# Patient Record
Sex: Male | Born: 1965 | Race: White | Hispanic: No | Marital: Married | Smoking: Never smoker
Health system: Southern US, Community
[De-identification: ages and names within clinical notes are randomized; demographics above are authoritative.]

## PROBLEM LIST (undated history)

## (undated) DIAGNOSIS — T7840XA Allergy, unspecified, initial encounter: Secondary | ICD-10-CM

## (undated) DIAGNOSIS — I10 Essential (primary) hypertension: Secondary | ICD-10-CM

## (undated) HISTORY — DX: Allergy, unspecified, initial encounter: T78.40XA

## (undated) HISTORY — DX: Essential (primary) hypertension: I10

---

## 2005-03-24 ENCOUNTER — Ambulatory Visit: Payer: Self-pay | Admitting: Internal Medicine

## 2012-08-02 ENCOUNTER — Ambulatory Visit: Payer: Self-pay | Admitting: Gastroenterology

## 2012-08-29 ENCOUNTER — Ambulatory Visit: Payer: Self-pay | Admitting: Urology

## 2014-10-16 ENCOUNTER — Ambulatory Visit: Payer: Self-pay

## 2015-03-26 ENCOUNTER — Emergency Department
Admit: 2015-03-26 | Disposition: A | Discharge status: Home / Self Care | Payer: Self-pay | Admitting: Emergency Medicine

## 2015-06-26 ENCOUNTER — Other Ambulatory Visit: Payer: Self-pay | Admitting: Family Medicine

## 2016-01-29 ENCOUNTER — Other Ambulatory Visit: Payer: Self-pay | Admitting: Family Medicine

## 2016-01-31 NOTE — Telephone Encounter (Signed)
apt 

## 2016-02-21 ENCOUNTER — Encounter: Payer: Self-pay | Admitting: Family Medicine

## 2016-02-21 NOTE — Telephone Encounter (Signed)
Letter sent.

## 2016-03-08 DIAGNOSIS — I10 Essential (primary) hypertension: Secondary | ICD-10-CM | POA: Insufficient documentation

## 2016-03-14 ENCOUNTER — Encounter: Payer: Self-pay | Admitting: Family Medicine

## 2016-03-14 ENCOUNTER — Ambulatory Visit (INDEPENDENT_AMBULATORY_CARE_PROVIDER_SITE_OTHER): Payer: BLUE CROSS/BLUE SHIELD | Admitting: Family Medicine

## 2016-03-14 VITALS — BP 149/87 | HR 69 | Temp 97.8°F | Ht 67.1 in | Wt 188.0 lb

## 2016-03-14 DIAGNOSIS — I10 Essential (primary) hypertension: Secondary | ICD-10-CM

## 2016-03-14 MED ORDER — AMLODIPINE BESYLATE 5 MG PO TABS: 5.0000 mg | ORAL_TABLET | Freq: Every day | ORAL | Status: DC

## 2016-03-14 NOTE — Assessment & Plan Note (Signed)
Discuss hypertension poor control need for better control with risk-benefit of therapy will start amlodipine 5 mg

## 2016-03-14 NOTE — Progress Notes (Signed)
   BP 149/87 mmHg  Pulse 69  Temp(Src) 97.8 F (36.6 C)  Ht 5' 7.1" (1.704 m)  Wt 188 lb (85.276 kg)  BMI 29.37 kg/m2  SpO2 99%   Subjective:    Patient ID: Shane Schmidt, male    DOB: [birth year], 50 y.o.   MRN: [id number]  HPI: Shane Schmidt is a 50 y.o. male  Chief Complaint  Patient presents with  . Hypertension   Patient with a lot of stress at work and pressures been elevated taking Benzapril faithfully without problems or side effects. Patient's with normal sleep normal energy no other problems or concerns Relevant past medical, surgical, family and social history reviewed and updated as indicated. Interim medical history since our last visit reviewed. Allergies and medications reviewed and updated.  Review of Systems  Constitutional: Negative.   Respiratory: Negative.   Cardiovascular: Negative.     Per HPI unless specifically indicated above     Objective:    BP 149/87 mmHg  Pulse 69  Temp(Src) 97.8 F (36.6 C)  Ht 5' 7.1" (1.704 m)  Wt 188 lb (85.276 kg)  BMI 29.37 kg/m2  SpO2 99%  Wt Readings from Last 3 Encounters:  03/14/16 188 lb (85.276 kg)  06/08/14 189 lb (85.73 kg)    Physical Exam  Constitutional: He is oriented to person, place, and time. He appears well-developed and well-nourished. No distress.  HENT:  Head: Normocephalic and atraumatic.  Right Ear: Hearing normal.  Left Ear: Hearing normal.  Nose: Nose normal.  Eyes: Conjunctivae and lids are normal. Right eye exhibits no discharge. Left eye exhibits no discharge. No scleral icterus.  Cardiovascular: Normal rate, regular rhythm and normal heart sounds.   Pulmonary/Chest: Effort normal and breath sounds normal. No respiratory distress.  Musculoskeletal: Normal range of motion.  Neurological: He is alert and oriented to person, place, and time.  Skin: Skin is intact. No rash noted.  Psychiatric: He has a normal mood and affect. His speech is normal and  behavior is normal. Judgment and thought content normal. Cognition and memory are normal.    No results found for this or any previous visit.    Assessment & Plan:   Problem List Items Addressed This Visit      Cardiovascular and Mediastinum   Hypertension - Primary    Discuss hypertension poor control need for better control with risk-benefit of therapy will start amlodipine 5 mg      Relevant Medications   amLODipine (NORVASC) 5 MG tablet       Follow up plan: Return in about 4 weeks (around 04/11/2016) for BMP recheck blood pressure.

## 2016-03-26 ENCOUNTER — Other Ambulatory Visit: Payer: Self-pay | Admitting: Family Medicine

## 2016-04-11 ENCOUNTER — Encounter: Payer: Self-pay | Admitting: Family Medicine

## 2016-04-11 ENCOUNTER — Ambulatory Visit (INDEPENDENT_AMBULATORY_CARE_PROVIDER_SITE_OTHER): Payer: BLUE CROSS/BLUE SHIELD | Admitting: Family Medicine

## 2016-04-11 VITALS — BP 130/82 | HR 71 | Temp 97.8°F | Ht 67.1 in | Wt 191.0 lb

## 2016-04-11 DIAGNOSIS — I10 Essential (primary) hypertension: Secondary | ICD-10-CM

## 2016-04-11 NOTE — Progress Notes (Signed)
   BP 130/82 mmHg  Pulse 71  Temp(Src) 97.8 F (36.6 C)  Ht 5' 7.1" (1.704 m)  Wt 191 lb (86.637 kg)  BMI 29.84 kg/m2  SpO2 99%   Subjective:    Patient ID: Shane Schmidt, male    DOB: 31-Dec-1965, 50 y.o.   MRN: [id number]  HPI: Shane Elchanan Dalesandro is a 50 y.o. male  Chief Complaint  Patient presents with  . Hypertension   Patient has been doing better with lifestyle eating better and exercising decrease salt Has not been taking medications Has home blood pressure monitoring which is showing good blood pressures all except for one elevated blood pressure reading Relevant past medical, surgical, family and social history reviewed and updated as indicated. Interim medical history since our last visit reviewed. Allergies and medications reviewed and updated.  Review of Systems  Per HPI unless specifically indicated above     Objective:    BP 130/82 mmHg  Pulse 71  Temp(Src) 97.8 F (36.6 C)  Ht 5' 7.1" (1.704 m)  Wt 191 lb (86.637 kg)  BMI 29.84 kg/m2  SpO2 99%  Wt Readings from Last 3 Encounters:  04/11/16 191 lb (86.637 kg)  03/14/16 188 lb (85.276 kg)  06/08/14 189 lb (85.73 kg)    Physical Exam  No results found for this or any previous visit.    Assessment & Plan:   Problem List Items Addressed This Visit      Cardiovascular and Mediastinum   Hypertension - Primary    Lifestyle controlled not on any medicines will continue blood pressure monitoring and good lifestyle      Relevant Orders   Basic metabolic panel       Follow up plan: Return for Physical Exam.

## 2016-04-11 NOTE — Assessment & Plan Note (Signed)
Lifestyle controlled not on any medicines will continue blood pressure monitoring and good lifestyle

## 2016-04-12 ENCOUNTER — Encounter: Payer: Self-pay | Admitting: Family Medicine

## 2016-04-12 LAB — BASIC METABOLIC PANEL
BUN/Creatinine Ratio: 22 — ABNORMAL HIGH (ref 9–20)
BUN: 21 mg/dL (ref 6–24)
CALCIUM: 9.4 mg/dL (ref 8.7–10.2)
CO2: 26 mmol/L (ref 18–29)
Chloride: 101 mmol/L (ref 96–106)
Creatinine, Ser: 0.94 mg/dL (ref 0.76–1.27)
GFR calc Af Amer: 110 mL/min/{1.73_m2} (ref >59)
GFR calc non Af Amer: 95 mL/min/{1.73_m2} (ref >59)
Glucose: 102 mg/dL — ABNORMAL HIGH (ref 65–99)
Potassium: 4.3 mmol/L (ref 3.5–5.2)
SODIUM: 144 mmol/L (ref 134–144)

## 2016-05-28 ENCOUNTER — Other Ambulatory Visit: Payer: Self-pay | Admitting: Family Medicine

## 2016-06-26 ENCOUNTER — Other Ambulatory Visit: Payer: Self-pay | Admitting: Family Medicine

## 2016-06-26 NOTE — Telephone Encounter (Signed)
Your patient.  Thanks 

## 2016-09-20 IMAGING — CT CT HEAD WITHOUT CONTRAST
3 of 6 series · 14 of 47 positions shown, 16 images · non-contrast
Comparison: None.

CLINICAL DATA: Patient struck in head by E[employer] loaded object.
Head pain. Neck pain.

EXAM:
CT HEAD WITHOUT CONTRAST
CT CERVICAL SPINE WITHOUT CONTRAST
TECHNIQUE: Multidetector CT imaging of the head and cervical spine was
performed following the standard protocol without intravenous
contrast. Multiplanar CT image reconstructions of the cervical spine
were also generated.

[Series 6: sag bone · sagittal · 0.27mm/px · 3 of 76 slices shown]
[im 26/76  brain]
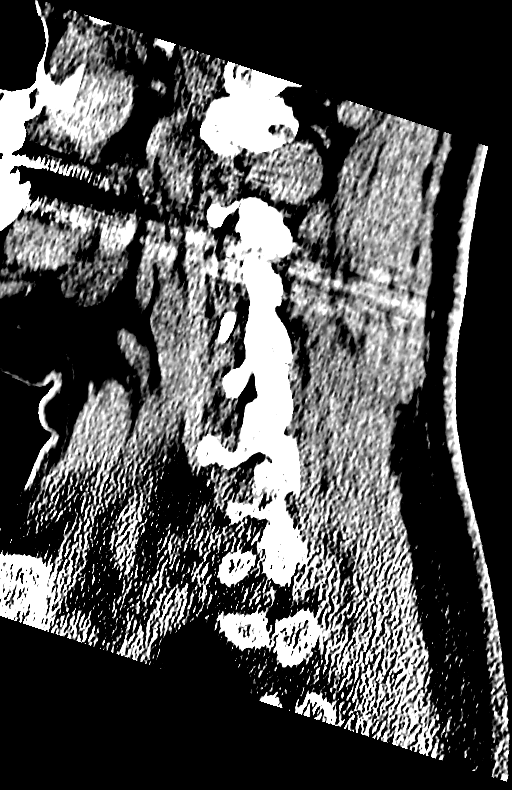
[im 38/76  brain]
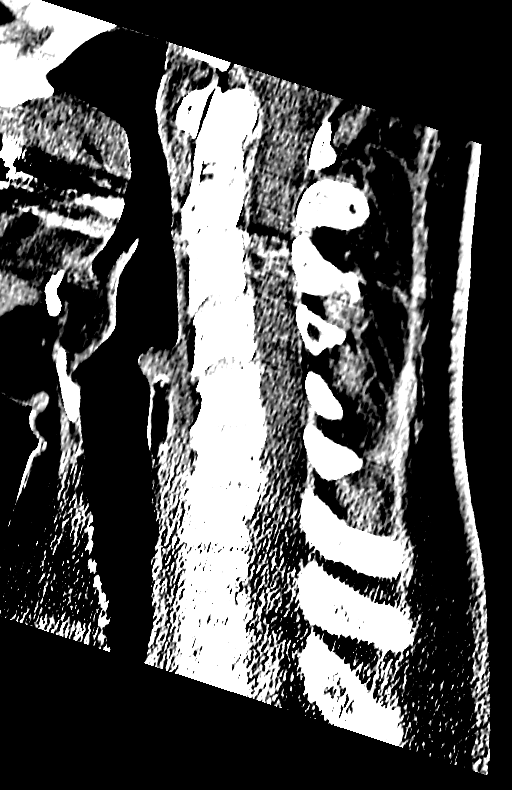
[im 51/76  brain]
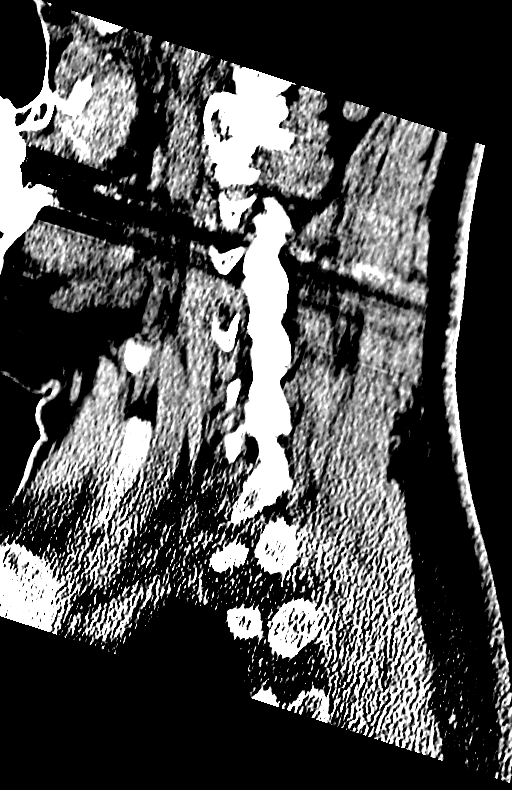

[Series 7: cor bone · coronal · 0.34mm/px · 3 of 74 slices shown]
[im 25/74  brain]
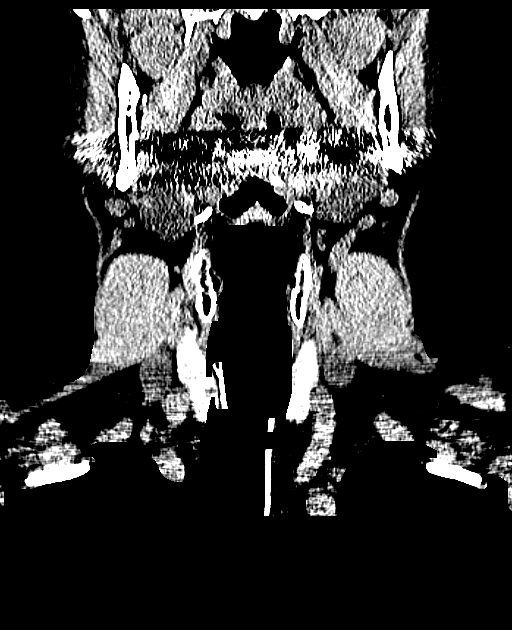
[im 33/74  brain]
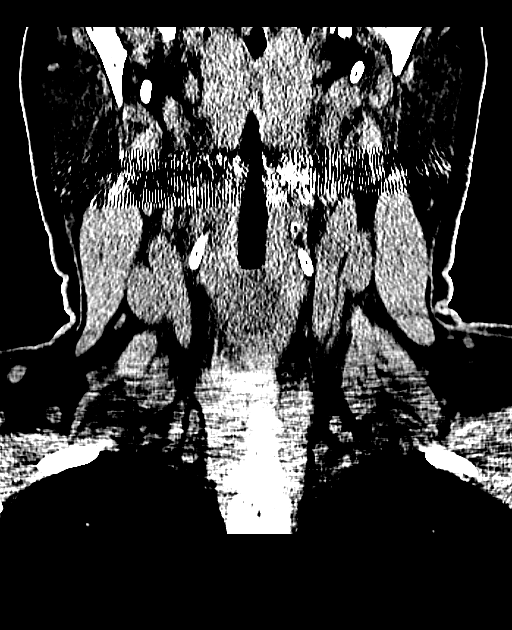
[im 41/74  brain]
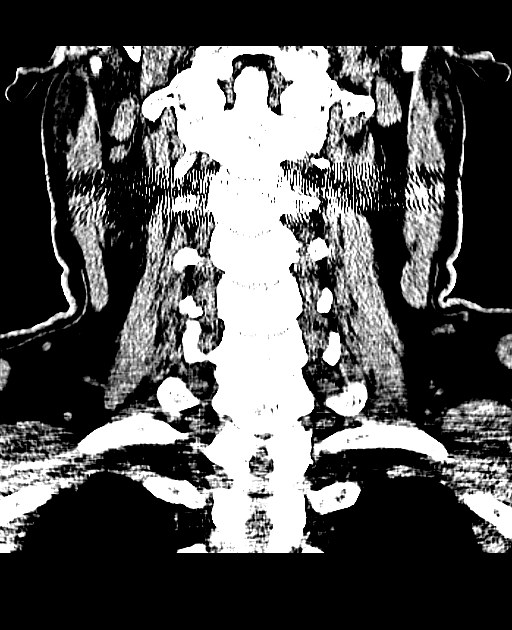

[Series 8: orthogonal axials · axial · 0.29mm/px · z∈[-400,-259]mm · 8 of 98 slices shown, 10 images]
[im 11/98  brain]
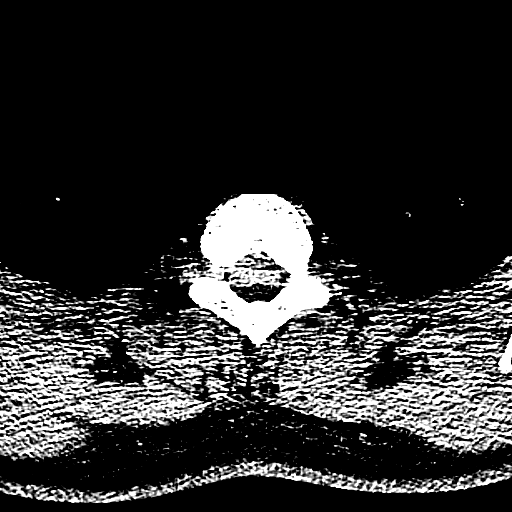
[im 11/98  bone]
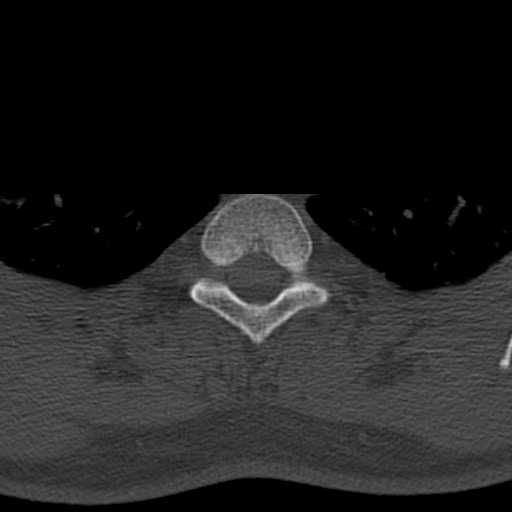
[im 22/98  brain]
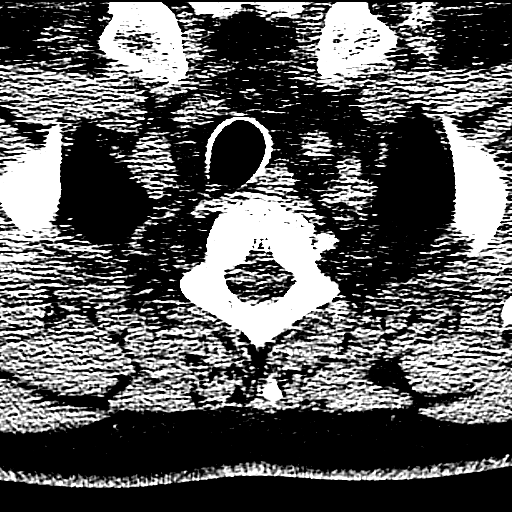
[im 33/98  brain]
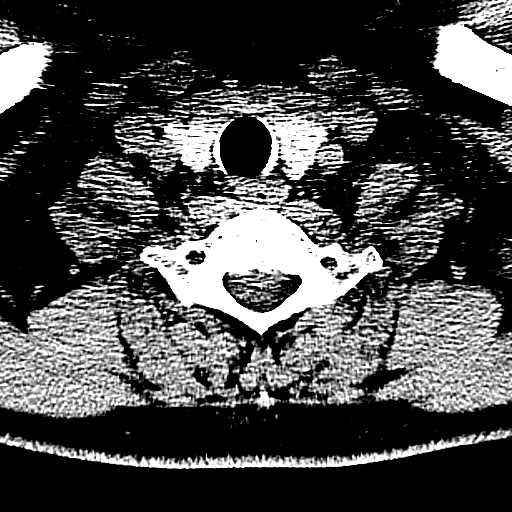
[im 44/98  brain]
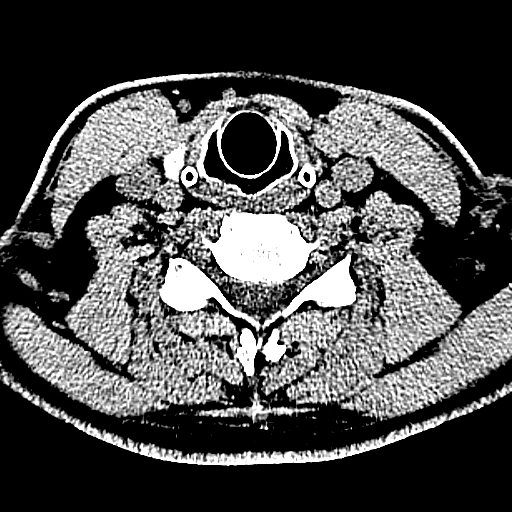
[im 54/98  brain]
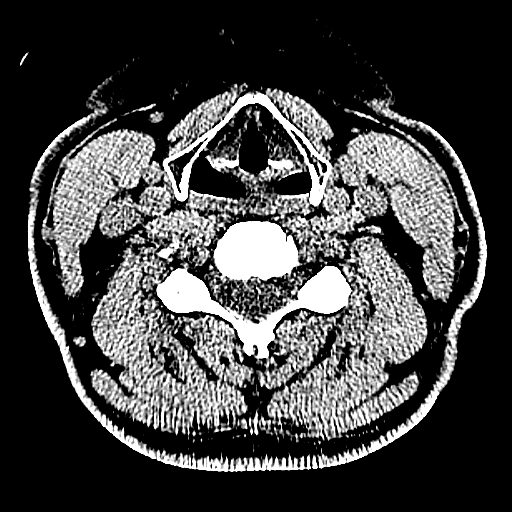
[im 54/98  bone]
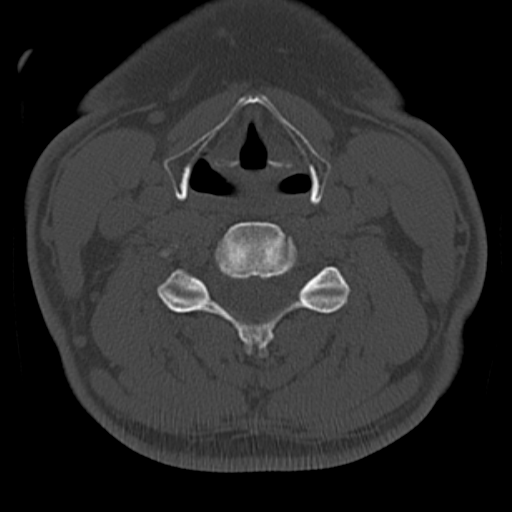
[im 65/98  brain]
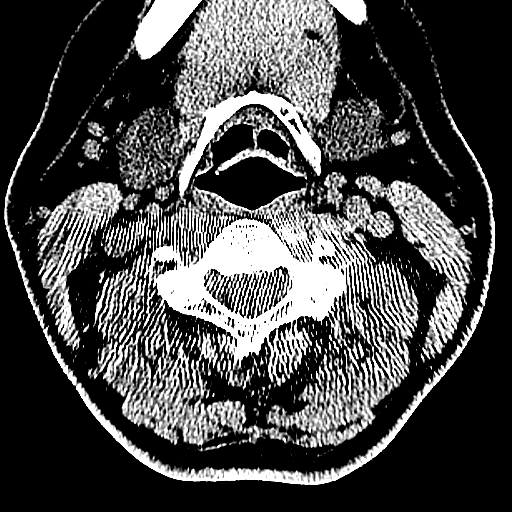
[im 76/98  brain]
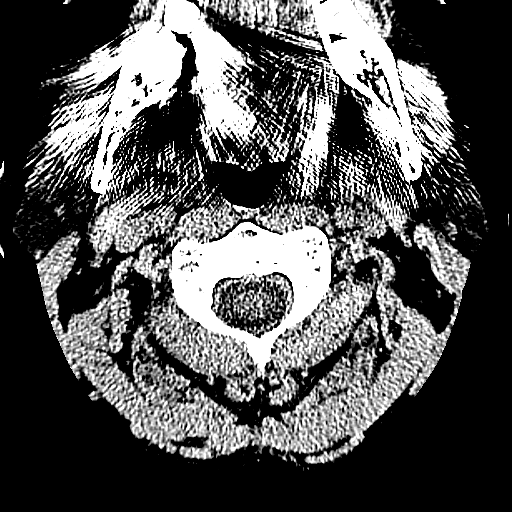
[im 87/98  brain]
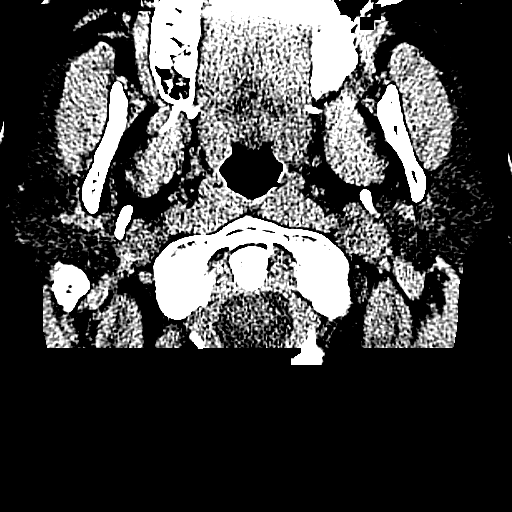

[14 of 47 positions shown; findings below may reference images not displayed]

FINDINGS: CT HEAD FINDINGS

No evidence for acute infarction, hemorrhage, mass lesion,
hydrocephalus, or extra-axial fluid. Normal cerebral volume. No
white matter disease. Calvarium intact. No sinus or mastoid disease.
There is mild LEFT frontal scalp soft tissue swelling.

CT CERVICAL SPINE FINDINGS

There is no visible cervical spine fracture, traumatic subluxation,
prevertebral soft tissue swelling, or intraspinal hematoma. Mild
reversal of the normal cervical lordotic curve likely positional but
could be due to spasm. There is moderate disc space narrowing at
C5-6. Multilevel facet arthropathy.
IMPRESSION: No skull fracture or intracranial hemorrhage. LEFT frontal scalp
soft tissue swelling.

No acute cervical spine findings. Mild reversal of the normal
cervical lordotic curve. Multilevel spondylosis.

## 2016-10-19 ENCOUNTER — Telehealth: Payer: Self-pay

## 2016-10-19 DIAGNOSIS — N200 Calculus of kidney: Secondary | ICD-10-CM

## 2016-10-19 NOTE — Telephone Encounter (Signed)
Patient called in, he would like a referral for Dr.Lipkin at [facility] for recurrent kidney stones.  Order placed

## 2016-11-16 DIAGNOSIS — N200 Calculus of kidney: Secondary | ICD-10-CM | POA: Insufficient documentation

## 2016-12-18 ENCOUNTER — Other Ambulatory Visit: Payer: Self-pay | Admitting: Family Medicine

## 2016-12-18 NOTE — Telephone Encounter (Signed)
Apt pe 

## 2016-12-18 NOTE — Telephone Encounter (Signed)
Your patient 

## 2016-12-22 ENCOUNTER — Ambulatory Visit (INDEPENDENT_AMBULATORY_CARE_PROVIDER_SITE_OTHER): Payer: 59

## 2016-12-22 DIAGNOSIS — Z23 Encounter for immunization: Secondary | ICD-10-CM | POA: Diagnosis not present

## 2016-12-27 NOTE — Telephone Encounter (Signed)
LMOM to call back

## 2017-02-08 DIAGNOSIS — H1045 Other chronic allergic conjunctivitis: Secondary | ICD-10-CM | POA: Diagnosis not present

## 2017-05-28 ENCOUNTER — Other Ambulatory Visit: Payer: Self-pay | Admitting: Family Medicine

## 2017-05-28 NOTE — Telephone Encounter (Signed)
apt 

## 2017-06-28 ENCOUNTER — Other Ambulatory Visit: Payer: Self-pay | Admitting: Family Medicine

## 2017-07-31 ENCOUNTER — Other Ambulatory Visit: Payer: Self-pay | Admitting: Family Medicine

## 2017-07-31 NOTE — Telephone Encounter (Signed)
Last OV: 04/11/16 Next OV: None on file   BMP Latest Ref Rng & Units 04/11/2016  Glucose 65 - 99 mg/dL 102(H)  BUN 6 - 24 mg/dL 21  Creatinine 0.76 - 1.27 mg/dL 0.94  BUN/Creat Ratio 9 - 20 22(H)  Sodium 134 - 144 mmol/L 144  Potassium 3.5 - 5.2 mmol/L 4.3  Chloride 96 - 106 mmol/L 101  CO2 18 - 29 mmol/L 26  Calcium 8.7 - 10.2 mg/dL 9.4

## 2017-08-02 ENCOUNTER — Encounter: Payer: Self-pay | Admitting: Family Medicine

## 2017-08-13 ENCOUNTER — Encounter: Payer: Self-pay | Admitting: Family Medicine

## 2017-08-13 ENCOUNTER — Ambulatory Visit (INDEPENDENT_AMBULATORY_CARE_PROVIDER_SITE_OTHER): Payer: 59 | Admitting: Family Medicine

## 2017-08-13 VITALS — BP 128/80 | HR 65 | Ht 68.0 in | Wt 183.0 lb

## 2017-08-13 DIAGNOSIS — Z0000 Encounter for general adult medical examination without abnormal findings: Secondary | ICD-10-CM

## 2017-08-13 DIAGNOSIS — I10 Essential (primary) hypertension: Secondary | ICD-10-CM

## 2017-08-13 DIAGNOSIS — Z1329 Encounter for screening for other suspected endocrine disorder: Secondary | ICD-10-CM

## 2017-08-13 DIAGNOSIS — Z131 Encounter for screening for diabetes mellitus: Secondary | ICD-10-CM

## 2017-08-13 DIAGNOSIS — Z125 Encounter for screening for malignant neoplasm of prostate: Secondary | ICD-10-CM | POA: Diagnosis not present

## 2017-08-13 DIAGNOSIS — Z13220 Encounter for screening for lipoid disorders: Secondary | ICD-10-CM

## 2017-08-13 DIAGNOSIS — Z23 Encounter for immunization: Secondary | ICD-10-CM | POA: Diagnosis not present

## 2017-08-13 DIAGNOSIS — Z1211 Encounter for screening for malignant neoplasm of colon: Secondary | ICD-10-CM

## 2017-08-13 LAB — URINALYSIS, ROUTINE W REFLEX MICROSCOPIC
BILIRUBIN UA: NEGATIVE
GLUCOSE, UA: NEGATIVE
Ketones, UA: NEGATIVE
Leukocytes, UA: NEGATIVE
NITRITE UA: NEGATIVE
PROTEIN UA: NEGATIVE
Specific Gravity, UA: 1.01 (ref 1.005–1.030)
UUROB: 0.2 mg/dL (ref 0.2–1.0)
pH, UA: 7 (ref 5.0–7.5)

## 2017-08-13 LAB — MICROSCOPIC EXAMINATION: WBC UA: NONE SEEN /HPF (ref 0–?)

## 2017-08-13 MED ORDER — BENAZEPRIL HCL 20 MG PO TABS: 20.0000 mg | ORAL_TABLET | Freq: Every day | ORAL | 12 refills | Status: DC

## 2017-08-13 NOTE — Progress Notes (Signed)
BP 128/80 (BP Location: Left Arm)   Pulse 65   Ht 5\' 8"  (1.727 m)   Wt 183 lb (83 kg)   SpO2 98%   BMI 27.83 kg/m    Subjective:    Patient ID: Shane Schmidt, male    DOB: [birth year], 51 y.o.   MRN: [id number]  HPI: Shane Schmidt is a 51 y.o. male presenting on 08/13/2017 for comprehensive medical examination. Current medical complaints include:none Blood pressure doing well with Benzapril blood pressure monitoring indicating good control. Takes with no side effects and takes faithfully. Taking reduced dose of 20mg . He currently lives with: Interim Problems from his last visit: no  Depression Screen done today and results listed below:  Depression screen [facility] 2/9 08/13/2017  Decreased Interest 0  Down, Depressed, Hopeless 0  PHQ - 2 Score 0       Past Medical History:  Past Medical History:  Diagnosis Date  . Allergy   . Hypertension     Surgical History:  No past surgical history on file.  Medications:  No current outpatient prescriptions on file prior to visit.   No current facility-administered medications on file prior to visit.     Allergies:  Allergies  Allergen Reactions  . Penicillins Rash    Social History:  Social History   Social History  . Marital status: Married    Spouse name: N/A  . Number of children: N/A  . Years of education: N/A   Occupational History  . Not on file.   Social History Main Topics  . Smoking status: Never Smoker  . Smokeless tobacco: Never Used  . Alcohol use No  . Drug use: No  . Sexual activity: Not on file   Other Topics Concern  . Not on file   Social History Narrative  . No narrative on file   History  Smoking Status  . Never Smoker  Smokeless Tobacco  . Never Used   History  Alcohol Use No    Family History:  Family History  Problem Relation Age of Onset  . Hypertension Brother     Past medical history, surgical history, medications, allergies, family history  and social history reviewed with patient today and changes made to appropriate areas of the chart.   Review of Systems - All other ROS negative except what is listed above and in the HPI.      Objective:    BP 128/80 (BP Location: Left Arm)   Pulse 65   Ht 5\' 8"  (1.727 m)   Wt 183 lb (83 kg)   SpO2 98%   BMI 27.83 kg/m   Wt Readings from Last 3 Encounters:  08/13/17 183 lb (83 kg)  04/11/16 191 lb (86.6 kg)  03/14/16 188 lb (85.3 kg)    Physical Exam  Constitutional: He is oriented to person, place, and time. He appears well-developed and well-nourished.  HENT:  Head: Normocephalic and atraumatic.  Right Ear: External ear normal.  Left Ear: External ear normal.  Eyes: Pupils are equal, round, and reactive to light. Conjunctivae and EOM are normal.  Neck: Normal range of motion. Neck supple.  Cardiovascular: Normal rate, regular rhythm, normal heart sounds and intact distal pulses.   Pulmonary/Chest: Effort normal and breath sounds normal.  Abdominal: Soft. Bowel sounds are normal. There is no splenomegaly or hepatomegaly.  Genitourinary: Rectum normal, prostate normal and penis normal.  Musculoskeletal: Normal range of motion.  Neurological: He is alert and oriented to person, place, and  time. He has normal reflexes.  Skin: No rash noted. No erythema.  Psychiatric: He has a normal mood and affect. His behavior is normal. Judgment and thought content normal.    Results for orders placed or performed in visit on 63/14/97  Basic metabolic panel  Result Value Ref Range   Glucose 102 (H) 65 - 99 mg/dL   BUN 21 6 - 24 mg/dL   Creatinine, Ser 0.94 0.76 - 1.27 mg/dL   GFR calc non Af Amer 95 >59 mL/min/1.73   GFR calc Af Amer 110 >59 mL/min/1.73   BUN/Creatinine Ratio 22 (H) 9 - 20   Sodium 144 134 - 144 mmol/L   Potassium 4.3 3.5 - 5.2 mmol/L   Chloride 101 96 - 106 mmol/L   CO2 26 18 - 29 mmol/L   Calcium 9.4 8.7 - 10.2 mg/dL      Assessment & Plan:   Problem List  Items Addressed This Visit      Cardiovascular and Mediastinum   Hypertension - Primary    Continue lower dose medications.      Relevant Medications   benazepril (LOTENSIN) 20 MG tablet   Other Relevant Orders   CBC with Differential/Platelet    Other Visit Diagnoses    Annual physical exam       Colon cancer screening       Relevant Orders   Ambulatory referral to Gastroenterology   Screening for diabetes mellitus (DM)       Relevant Orders   Comprehensive metabolic panel   Urinalysis, Routine w reflex microscopic   Screening cholesterol level       Relevant Orders   Lipid panel   Prostate cancer screening       Relevant Orders   PSA   Thyroid disorder screen       Relevant Orders   TSH   Needs flu shot       Relevant Orders   Flu Vaccine QUAD 6+ mos PF IM (Fluarix Quad PF) (Completed)       Discussed aspirin prophylaxis for myocardial infarction prevention and decision was it was not indicated  LABORATORY TESTING:  Health maintenance labs ordered today as discussed above.   The natural history of prostate cancer and ongoing controversy regarding screening and potential treatment outcomes of prostate cancer has been discussed with the patient. The meaning of a false positive PSA and a false negative PSA has been discussed. He indicates understanding of the limitations of this screening test and wishes  to proceed with screening PSA testing.   IMMUNIZATIONS:   - Tdap: Tetanus vaccination status reviewed: last tetanus booster within 10 years. - Influenza: Administered today - Pneumovax: Not applicable - Prevnar: Not applicable - Zostavax vaccine: Not applicable  SCREENING: - Colonoscopy: Ordered today  Discussed with patient purpose of the colonoscopy is to detect colon cancer at curable precancerous or early stages   - AAA Screening: Not applicable  -Hearing Test: Not applicable  -Spirometry: Not applicable   PATIENT COUNSELING:    Sexuality: Discussed  sexually transmitted diseases, partner selection, use of condoms, avoidance of unintended pregnancy  and contraceptive alternatives.   Advised to avoid cigarette smoking.  I discussed with the patient that most people either abstain from alcohol or drink within safe limits (<=14/week and <=4 drinks/occasion for males, <=7/weeks and <= 3 drinks/occasion for females) and that the risk for alcohol disorders and other health effects rises proportionally with the number of drinks per week and how often a drinker  exceeds daily limits.  Discussed cessation/primary prevention of drug use and availability of treatment for abuse.   Diet: Encouraged to adjust caloric intake to maintain  or achieve ideal body weight, to reduce intake of dietary saturated fat and total fat, to limit sodium intake by avoiding high sodium foods and not adding table salt, and to maintain adequate dietary potassium and calcium preferably from fresh fruits, vegetables, and low-fat dairy products.    stressed the importance of regular exercise  Injury prevention: Discussed safety belts, safety helmets, smoke detector, smoking near bedding or upholstery.   Dental health: Discussed importance of regular tooth brushing, flossing, and dental visits.   Follow up plan: NEXT PREVENTATIVE PHYSICAL DUE IN 1 YEAR. Return in about 6 months (around 02/10/2018) for BMP.

## 2017-08-13 NOTE — Assessment & Plan Note (Signed)
Continue lower dose medications.

## 2017-08-13 NOTE — Patient Instructions (Addendum)

## 2017-08-14 ENCOUNTER — Encounter: Payer: Self-pay | Admitting: Family Medicine

## 2017-08-14 LAB — COMPREHENSIVE METABOLIC PANEL
A/G RATIO: 1.8 (ref 1.2–2.2)
ALK PHOS: 59 IU/L (ref 39–117)
ALT: 34 IU/L (ref 0–44)
AST: 30 IU/L (ref 0–40)
Albumin: 4.6 g/dL (ref 3.5–5.5)
BILIRUBIN TOTAL: 1.5 mg/dL — AB (ref 0.0–1.2)
BUN/Creatinine Ratio: 22 — ABNORMAL HIGH (ref 9–20)
BUN: 22 mg/dL (ref 6–24)
CO2: 24 mmol/L (ref 20–29)
Calcium: 9.7 mg/dL (ref 8.7–10.2)
Chloride: 99 mmol/L (ref 96–106)
Creatinine, Ser: 1 mg/dL (ref 0.76–1.27)
GFR calc Af Amer: 101 mL/min/{1.73_m2} (ref >59)
GFR calc non Af Amer: 87 mL/min/{1.73_m2} (ref >59)
Globulin, Total: 2.6 g/dL (ref 1.5–4.5)
Glucose: 79 mg/dL (ref 65–99)
POTASSIUM: 4.3 mmol/L (ref 3.5–5.2)
Sodium: 143 mmol/L (ref 134–144)
Total Protein: 7.2 g/dL (ref 6.0–8.5)

## 2017-08-14 LAB — CBC WITH DIFFERENTIAL/PLATELET
Basophils Absolute: 0.1 10*3/uL (ref 0.0–0.2)
Basos: 1 %
EOS (ABSOLUTE): 0.5 10*3/uL — AB (ref 0.0–0.4)
Eos: 7 %
Hematocrit: 47.4 % (ref 37.5–51.0)
Hemoglobin: 15.7 g/dL (ref 13.0–17.7)
Immature Grans (Abs): 0 10*3/uL (ref 0.0–0.1)
Immature Granulocytes: 0 %
Lymphocytes Absolute: 2.5 10*3/uL (ref 0.7–3.1)
Lymphs: 38 %
MCH: 28.9 pg (ref 26.6–33.0)
MCHC: 33.1 g/dL (ref 31.5–35.7)
MCV: 87 fL (ref 79–97)
Monocytes Absolute: 0.5 10*3/uL (ref 0.1–0.9)
Monocytes: 8 %
NEUTROS ABS: 3.1 10*3/uL (ref 1.4–7.0)
Neutrophils: 46 %
PLATELETS: 291 10*3/uL (ref 150–379)
RBC: 5.44 x10E6/uL (ref 4.14–5.80)
RDW: 14.3 % (ref 12.3–15.4)
WBC: 6.7 10*3/uL (ref 3.4–10.8)

## 2017-08-14 LAB — LIPID PANEL
CHOLESTEROL TOTAL: 167 mg/dL (ref 100–199)
Chol/HDL Ratio: 3.3 ratio (ref 0.0–5.0)
HDL: 50 mg/dL (ref >39)
LDL Calculated: 98 mg/dL (ref 0–99)
Triglycerides: 96 mg/dL (ref 0–149)
VLDL Cholesterol Cal: 19 mg/dL (ref 5–40)

## 2017-08-14 LAB — TSH: TSH: 2.28 u[IU]/mL (ref 0.450–4.500)

## 2017-08-14 LAB — PSA: Prostate Specific Ag, Serum: 1.2 ng/mL (ref 0.0–4.0)

## 2017-08-28 ENCOUNTER — Other Ambulatory Visit: Payer: Self-pay | Admitting: Unknown Physician Specialty

## 2017-08-28 NOTE — Telephone Encounter (Signed)
Your patient.  Thanks 

## 2017-10-16 ENCOUNTER — Telehealth: Payer: Self-pay

## 2017-10-16 DIAGNOSIS — Z1211 Encounter for screening for malignant neoplasm of colon: Secondary | ICD-10-CM

## 2017-10-16 NOTE — Telephone Encounter (Signed)
Per provider pt wishes to go to [facility] instead of [facility]. [facility] referral had already been closed, new referral generated.

## 2017-10-22 DIAGNOSIS — N200 Calculus of kidney: Secondary | ICD-10-CM | POA: Diagnosis not present

## 2017-12-11 DIAGNOSIS — N200 Calculus of kidney: Secondary | ICD-10-CM | POA: Diagnosis not present

## 2017-12-11 DIAGNOSIS — R82991 Hypocitraturia: Secondary | ICD-10-CM | POA: Diagnosis not present

## 2017-12-11 DIAGNOSIS — R82993 Hyperuricosuria: Secondary | ICD-10-CM | POA: Diagnosis not present

## 2017-12-27 DIAGNOSIS — K648 Other hemorrhoids: Secondary | ICD-10-CM | POA: Diagnosis not present

## 2017-12-27 DIAGNOSIS — Z1211 Encounter for screening for malignant neoplasm of colon: Secondary | ICD-10-CM | POA: Diagnosis not present

## 2017-12-27 DIAGNOSIS — I129 Hypertensive chronic kidney disease with stage 1 through stage 4 chronic kidney disease, or unspecified chronic kidney disease: Secondary | ICD-10-CM | POA: Diagnosis not present

## 2017-12-27 DIAGNOSIS — I10 Essential (primary) hypertension: Secondary | ICD-10-CM | POA: Diagnosis not present

## 2017-12-27 LAB — HM COLONOSCOPY

## 2018-02-19 ENCOUNTER — Encounter: Payer: Self-pay | Admitting: Family Medicine

## 2018-02-19 ENCOUNTER — Ambulatory Visit (INDEPENDENT_AMBULATORY_CARE_PROVIDER_SITE_OTHER): Payer: 59 | Admitting: Family Medicine

## 2018-02-19 VITALS — BP 132/68 | HR 82 | Ht 68.0 in | Wt 182.0 lb

## 2018-02-19 DIAGNOSIS — I10 Essential (primary) hypertension: Secondary | ICD-10-CM

## 2018-02-19 DIAGNOSIS — Z114 Encounter for screening for human immunodeficiency virus [HIV]: Secondary | ICD-10-CM | POA: Diagnosis not present

## 2018-02-19 NOTE — Progress Notes (Signed)
   BP 132/68   Pulse 82   Ht 5\' 8"  (1.727 m)   Wt 182 lb (82.6 kg)   SpO2 98%   BMI 27.67 kg/m    Subjective:    Patient ID: Shane Schmidt, male    DOB: 03-09-1966, 52 y.o.   MRN: [id number]  HPI: Shane Schmidt is a 52 y.o. male  Chief Complaint  Patient presents with  . Follow-up  . Hypertension   Patient doing well with no complaints taking Benzapril without problems or issues good control of blood pressure at home. Patient also with weight gain is been on some medications and is now starting to watch diet nutrition especially sugars and carbohydrates.  Is starting to lose some weight. Relevant past medical, surgical, family and social history reviewed and updated as indicated. Interim medical history since our last visit reviewed. Allergies and medications reviewed and updated.  Review of Systems  Constitutional: Negative.   Respiratory: Negative.   Cardiovascular: Negative.     Per HPI unless specifically indicated above     Objective:    BP 132/68   Pulse 82   Ht 5\' 8"  (1.727 m)   Wt 182 lb (82.6 kg)   SpO2 98%   BMI 27.67 kg/m   Wt Readings from Last 3 Encounters:  02/19/18 182 lb (82.6 kg)  08/13/17 183 lb (83 kg)  04/11/16 191 lb (86.6 kg)    Physical Exam  Constitutional: He is oriented to person, place, and time. He appears well-developed and well-nourished.  HENT:  Head: Normocephalic and atraumatic.  Eyes: Conjunctivae and EOM are normal.  Neck: Normal range of motion.  Cardiovascular: Normal rate, regular rhythm and normal heart sounds.  Pulmonary/Chest: Effort normal and breath sounds normal.  Musculoskeletal: Normal range of motion.  Neurological: He is alert and oriented to person, place, and time.  Skin: No erythema.  Psychiatric: He has a normal mood and affect. His behavior is normal. Judgment and thought content normal.    Results for orders placed or performed in visit on 12/31/17  HM COLONOSCOPY  Result  Value Ref Range   HM Colonoscopy See Report (in chart) See Report (in chart), Patient Reported      Assessment & Plan:   Problem List Items Addressed This Visit      Cardiovascular and Mediastinum   Hypertension - Primary    The current medical regimen is effective;  continue present plan and medications.        Other Visit Diagnoses    Encounter for screening for HIV        Discussed weight gain nutrition diet exercise  Follow up plan: Return in about 6 months (around 08/22/2018) for Physical Exam.

## 2018-02-19 NOTE — Assessment & Plan Note (Signed)
The current medical regimen is effective;  continue present plan and medications.  

## 2018-08-27 ENCOUNTER — Other Ambulatory Visit: Payer: Self-pay | Admitting: Family Medicine

## 2018-09-05 ENCOUNTER — Encounter: Payer: 59 | Admitting: Family Medicine

## 2018-09-23 ENCOUNTER — Other Ambulatory Visit: Payer: Self-pay | Admitting: Family Medicine

## 2018-09-23 NOTE — Telephone Encounter (Signed)
Patient called, left VM to return call to the office to schedule and OV in order to receive refills.

## 2018-09-23 NOTE — Telephone Encounter (Signed)
Requested medication (s) are due for refill today: Yes  Requested medication (s) are on the active medication list: Yes  Last refill:  08/19/18  Future visit scheduled: NO  Notes to clinic:  Unable to refill per protocol, overdue labs/appointment     Requested Prescriptions  Pending Prescriptions Disp Refills   benazepril (LOTENSIN) 20 MG tablet [Pharmacy Med Name: BENAZEPRIL 20MG  TABLETS] 30 tablet 0    Sig: TAKE 1 TABLET BY MOUTH DAILY     Cardiovascular:  ACE Inhibitors Failed - 09/23/2018  5:56 PM      Failed - Cr in normal range and within 180 days    Creatinine, Ser  Date Value Ref Range Status  08/13/2017 1.00 0.76 - 1.27 mg/dL Final         Failed - K in normal range and within 180 days    Potassium  Date Value Ref Range Status  08/13/2017 4.3 3.5 - 5.2 mmol/L Final         Failed - Valid encounter within last 6 months    Recent Outpatient Visits          7 months ago Essential hypertension   [place] [facility], Jeannette How, MD   1 year ago Essential hypertension   [place] [facility], Jeannette How, MD   2 years ago Essential hypertension   [place], Jeannette How, MD   2 years ago Essential hypertension   [place], Jeannette How, MD             Passed - Patient is not pregnant      Passed - Last BP in normal range    BP Readings from Last 1 Encounters:  02/19/18 132/68

## 2018-09-24 NOTE — Telephone Encounter (Signed)
LVM for pt to schedule an appt.

## 2018-09-24 NOTE — Telephone Encounter (Signed)
Needs follow up appointment.  

## 2018-09-25 ENCOUNTER — Encounter: Payer: Self-pay | Admitting: Family Medicine

## 2018-09-25 NOTE — Telephone Encounter (Signed)
Letter printed to mail. °

## 2018-10-21 DIAGNOSIS — N200 Calculus of kidney: Secondary | ICD-10-CM | POA: Diagnosis not present

## 2018-10-28 ENCOUNTER — Ambulatory Visit: Payer: 59 | Admitting: Family Medicine

## 2018-10-28 ENCOUNTER — Encounter: Payer: Self-pay | Admitting: Family Medicine

## 2018-10-28 VITALS — BP 170/100 | HR 72 | Temp 98.4°F | Wt 183.0 lb

## 2018-10-28 DIAGNOSIS — N200 Calculus of kidney: Secondary | ICD-10-CM | POA: Diagnosis not present

## 2018-10-28 DIAGNOSIS — Z0000 Encounter for general adult medical examination without abnormal findings: Secondary | ICD-10-CM | POA: Diagnosis not present

## 2018-10-28 DIAGNOSIS — I10 Essential (primary) hypertension: Secondary | ICD-10-CM

## 2018-10-28 MED ORDER — BENAZEPRIL HCL 20 MG PO TABS: 20.0000 mg | ORAL_TABLET | Freq: Every day | ORAL | 4 refills | Status: DC

## 2018-10-28 NOTE — Progress Notes (Signed)
BP (!) 170/100   Pulse 72   Temp 98.4 F (36.9 C) (Oral)   Wt 183 lb (83 kg)   SpO2 98%   BMI 27.83 kg/m    Subjective:    Patient ID: Shane Schmidt, male    DOB: 1966-09-22, 52 y.o.   MRN: [id number]  HPI: Shane Schmidt is a 52 y.o. 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 Takes Benzapril 20 mg once a day without problems has taken 40 in the past without problems. Patient very concerned about taking more blood pressure medicine. Feels with lifestyle and self-care he can do better with his blood pressure which was encouraged.  Relevant past medical, surgical, family and social history reviewed and updated as indicated. Interim medical history since our last visit reviewed. Allergies and medications reviewed and updated.  Review of Systems  Constitutional: Negative.   HENT: Negative.   Eyes: Negative.   Respiratory: Negative.   Cardiovascular: Negative.   Gastrointestinal: Negative.   Endocrine: Negative.   Genitourinary: Negative.   Musculoskeletal: Negative.   Skin: Negative.   Allergic/Immunologic: Negative.   Neurological: Negative.   Hematological: Negative.   Psychiatric/Behavioral: Negative.     Per HPI unless specifically indicated above     Objective:    BP (!) 170/100   Pulse 72   Temp 98.4 F (36.9 C) (Oral)   Wt 183 lb (83 kg)   SpO2 98%   BMI 27.83 kg/m   Wt Readings from Last 3 Encounters:  10/28/18 183 lb (83 kg)  02/19/18 182 lb (82.6 kg)  08/13/17 183 lb (83 kg)    Physical Exam  Constitutional: He is oriented to person, place, and time. He appears well-developed and well-nourished.  HENT:  Head: Normocephalic and atraumatic.  Right Ear: External ear normal.  Left Ear: External ear normal.  Eyes: Pupils are equal, round, and reactive to light. Conjunctivae and EOM are normal.  Neck: Normal range of motion. Neck  supple.  Cardiovascular: Normal rate, regular rhythm, normal heart sounds and intact distal pulses.  Pulmonary/Chest: Effort normal and breath sounds normal.  Abdominal: Soft. Bowel sounds are normal. There is no splenomegaly or hepatomegaly.  Genitourinary: Rectum normal, prostate normal and penis normal.  Musculoskeletal: Normal range of motion.  Neurological: He is alert and oriented to person, place, and time. He has normal reflexes.  Skin: No rash noted. No erythema.  Psychiatric: He has a normal mood and affect. His behavior is normal. Judgment and thought content normal.    Results for orders placed or performed in visit on 12/31/17  HM COLONOSCOPY  Result Value Ref Range   HM Colonoscopy See Report (in chart) See Report (in chart), Patient Reported      Assessment & Plan:   Problem List Items Addressed This Visit      Cardiovascular and Mediastinum   Hypertension    Poor control blood pressure will increase benazepril from 20 mg increased to 40 mg.  Recheck in 1 to 2 months.      Relevant Medications   benazepril (LOTENSIN) 20 MG tablet     Genitourinary   Recurrent nephrolithiasis    No further problems at present       Other Visit Diagnoses    PE (physical exam), annual    -  Primary   Relevant Orders   Comprehensive metabolic panel   Lipid panel  CBC with Differential/Platelet   TSH   Urinalysis, Routine w reflex microscopic   PSA       Follow up plan: Return in about 2 months (around 12/28/2018) for BP check.

## 2018-10-28 NOTE — Assessment & Plan Note (Signed)
Poor control blood pressure will increase benazepril from 20 mg increased to 40 mg.  Recheck in 1 to 2 months.

## 2018-10-28 NOTE — Assessment & Plan Note (Signed)
No further problems at present

## 2018-10-29 ENCOUNTER — Encounter: Payer: Self-pay | Admitting: Family Medicine

## 2018-10-29 LAB — URINALYSIS, ROUTINE W REFLEX MICROSCOPIC
Bilirubin, UA: NEGATIVE
GLUCOSE, UA: NEGATIVE
Ketones, UA: NEGATIVE
Leukocytes, UA: NEGATIVE
Nitrite, UA: NEGATIVE
PH UA: 6.5 (ref 5.0–7.5)
Protein, UA: NEGATIVE
Specific Gravity, UA: 1.015 (ref 1.005–1.030)
Urobilinogen, Ur: 0.2 mg/dL (ref 0.2–1.0)

## 2018-10-29 LAB — COMPREHENSIVE METABOLIC PANEL
A/G RATIO: 1.7 (ref 1.2–2.2)
ALT: 29 IU/L (ref 0–44)
AST: 22 IU/L (ref 0–40)
Albumin: 4.8 g/dL (ref 3.5–5.5)
Alkaline Phosphatase: 63 IU/L (ref 39–117)
BUN/Creatinine Ratio: 21 — ABNORMAL HIGH (ref 9–20)
BUN: 22 mg/dL (ref 6–24)
Bilirubin Total: 1 mg/dL (ref 0.0–1.2)
CALCIUM: 9.3 mg/dL (ref 8.7–10.2)
CO2: 25 mmol/L (ref 20–29)
Chloride: 100 mmol/L (ref 96–106)
Creatinine, Ser: 1.03 mg/dL (ref 0.76–1.27)
GFR calc non Af Amer: 83 mL/min/{1.73_m2} (ref >59)
GFR, EST AFRICAN AMERICAN: 96 mL/min/{1.73_m2} (ref >59)
GLUCOSE: 83 mg/dL (ref 65–99)
Globulin, Total: 2.8 g/dL (ref 1.5–4.5)
POTASSIUM: 3.7 mmol/L (ref 3.5–5.2)
Sodium: 142 mmol/L (ref 134–144)
TOTAL PROTEIN: 7.6 g/dL (ref 6.0–8.5)

## 2018-10-29 LAB — CBC WITH DIFFERENTIAL/PLATELET
BASOS: 1 %
Basophils Absolute: 0.1 10*3/uL (ref 0.0–0.2)
EOS (ABSOLUTE): 0.5 10*3/uL — AB (ref 0.0–0.4)
Eos: 7 %
Hematocrit: 45.2 % (ref 37.5–51.0)
Hemoglobin: 15 g/dL (ref 13.0–17.7)
Immature Grans (Abs): 0 10*3/uL (ref 0.0–0.1)
Immature Granulocytes: 0 %
Lymphocytes Absolute: 3.1 10*3/uL (ref 0.7–3.1)
Lymphs: 40 %
MCH: 28.5 pg (ref 26.6–33.0)
MCHC: 33.2 g/dL (ref 31.5–35.7)
MCV: 86 fL (ref 79–97)
Monocytes Absolute: 0.5 10*3/uL (ref 0.1–0.9)
Monocytes: 7 %
NEUTROS ABS: 3.5 10*3/uL (ref 1.4–7.0)
Neutrophils: 45 %
PLATELETS: 291 10*3/uL (ref 150–450)
RBC: 5.26 x10E6/uL (ref 4.14–5.80)
RDW: 12.5 % (ref 12.3–15.4)
WBC: 7.7 10*3/uL (ref 3.4–10.8)

## 2018-10-29 LAB — MICROSCOPIC EXAMINATION: BACTERIA UA: NONE SEEN

## 2018-10-29 LAB — TSH: TSH: 2.26 u[IU]/mL (ref 0.450–4.500)

## 2018-10-29 LAB — LIPID PANEL
Chol/HDL Ratio: 3.2 ratio (ref 0.0–5.0)
Cholesterol, Total: 172 mg/dL (ref 100–199)
HDL: 54 mg/dL (ref >39)
LDL CALC: 98 mg/dL (ref 0–99)
TRIGLYCERIDES: 102 mg/dL (ref 0–149)
VLDL Cholesterol Cal: 20 mg/dL (ref 5–40)

## 2018-10-29 LAB — PSA: PROSTATE SPECIFIC AG, SERUM: 1.4 ng/mL (ref 0.0–4.0)

## 2018-12-10 DIAGNOSIS — N200 Calculus of kidney: Secondary | ICD-10-CM | POA: Diagnosis not present

## 2018-12-10 DIAGNOSIS — R82994 Hypercalciuria: Secondary | ICD-10-CM | POA: Diagnosis not present

## 2018-12-10 DIAGNOSIS — R82992 Hyperoxaluria: Secondary | ICD-10-CM | POA: Diagnosis not present

## 2018-12-24 ENCOUNTER — Other Ambulatory Visit: Payer: Self-pay | Admitting: Family Medicine

## 2018-12-24 NOTE — Telephone Encounter (Signed)
Requested Prescriptions  Pending Prescriptions Disp Refills  . benazepril (LOTENSIN) 20 MG tablet [Pharmacy Med Name: BENAZEPRIL 20MG  TABLETS] 60 tablet 0    Sig: TAKE 1 TABLET BY MOUTH DAILY     Cardiovascular:  ACE Inhibitors Failed - 12/24/2018  3:10 AM      Failed - Last BP in normal range    BP Readings from Last 1 Encounters:  10/28/18 (!) 170/100         Passed - Cr in normal range and within 180 days    Creatinine, Ser  Date Value Ref Range Status  10/28/2018 1.03 0.76 - 1.27 mg/dL Final         Passed - K in normal range and within 180 days    Potassium  Date Value Ref Range Status  10/28/2018 3.7 3.5 - 5.2 mmol/L Final         Passed - Patient is not pregnant      Passed - Valid encounter within last 6 months    Recent Outpatient Visits          1 month ago PE (physical exam), annual   [place] [facility], Jeannette How, MD   10 months ago Essential hypertension   [facility], Jeannette How, MD   1 year ago Essential hypertension   [place] [facility], Jeannette How, MD   2 years ago Essential hypertension   [place], Jeannette How, MD   2 years ago Essential hypertension   [place], Jeannette How, MD      Future Appointments            In 1 month [facility], Jeannette How, MD [facility]

## 2019-02-10 ENCOUNTER — Ambulatory Visit: Payer: 59 | Admitting: Family Medicine

## 2019-07-19 ENCOUNTER — Other Ambulatory Visit: Payer: Self-pay | Admitting: Family Medicine

## 2019-07-19 MED ORDER — BENAZEPRIL HCL 40 MG PO TABS: 40.0000 mg | ORAL_TABLET | Freq: Every day | ORAL | 2 refills | Status: DC

## 2020-07-26 ENCOUNTER — Other Ambulatory Visit: Payer: Self-pay

## 2020-07-26 ENCOUNTER — Encounter: Payer: Self-pay | Admitting: Nurse Practitioner

## 2020-07-26 ENCOUNTER — Ambulatory Visit (INDEPENDENT_AMBULATORY_CARE_PROVIDER_SITE_OTHER): Payer: 59 | Admitting: Nurse Practitioner

## 2020-07-26 VITALS — BP 170/90 | HR 69 | Temp 98.6°F | Ht 68.0 in | Wt 182.4 lb

## 2020-07-26 DIAGNOSIS — I10 Essential (primary) hypertension: Secondary | ICD-10-CM

## 2020-07-26 MED ORDER — BENAZEPRIL HCL 40 MG PO TABS: 40.0000 mg | ORAL_TABLET | Freq: Every day | ORAL | 1 refills | Status: DC

## 2020-07-26 NOTE — Progress Notes (Signed)
BP (!) 170/90 (BP Location: Left Arm, Patient Position: Sitting, Cuff Size: Normal)   Pulse 69   Temp 98.6 F (37 C) (Oral)   Ht 5\' 8"  (1.727 m)   Wt 182 lb 6.4 oz (82.7 kg)   SpO2 100%   BMI 27.73 kg/m    Subjective:    Patient ID: Shane Schmidt, male    DOB: Mar 04, 1966, 54 y.o.   MRN: [id number]  HPI: Shane Schmidt is a 54 y.o. male  Chief Complaint  Patient presents with  . Hypertension    Medication Refill    HYPERTENSION Hypertension status: controlled  Satisfied with current treatment? yes Duration of hypertension: chronic BP monitoring frequency:  daily BP range: 130s/80s BP medication side effects:  no Medication compliance: excellent compliance Previous BP meds: benazepril  Aspirin: no Recurrent headaches: no Visual changes: no Palpitations: no Dyspnea: no Chest pain: no Lower extremity edema: no Dizzy/lightheaded: no   Allergies  Allergen Reactions  . Penicillins Rash   Outpatient Encounter Medications as of 07/26/2020  Medication Sig  . benazepril (LOTENSIN) 40 MG tablet Take 1 tablet (40 mg total) by mouth daily.  . [DISCONTINUED] benazepril (LOTENSIN) 40 MG tablet Take 1 tablet (40 mg total) by mouth daily.   No facility-administered encounter medications on file as of 07/26/2020.   Patient Active Problem List   Diagnosis Date Noted  . Recurrent nephrolithiasis 11/16/2016  . Hypertension    Past Medical History:  Diagnosis Date  . Allergy   . Hypertension    Relevant past medical, surgical, family and social history reviewed and updated as indicated. Interim medical history since our last visit reviewed.  Review of Systems  Constitutional: Negative.  Negative for activity change, appetite change and fever.  Eyes: Negative.  Negative for visual disturbance.  Respiratory: Negative.  Negative for chest tightness, shortness of breath and wheezing.   Cardiovascular: Negative.  Negative for chest pain, palpitations  and leg swelling.  Gastrointestinal: Negative.  Negative for nausea and vomiting.  Skin: Negative.  Negative for rash.  Neurological: Negative.  Negative for dizziness, light-headedness, numbness and headaches.  Psychiatric/Behavioral: Negative.    Per HPI unless specifically indicated above     Objective:    BP (!) 170/90 (BP Location: Left Arm, Patient Position: Sitting, Cuff Size: Normal)   Pulse 69   Temp 98.6 F (37 C) (Oral)   Ht 5\' 8"  (1.727 m)   Wt 182 lb 6.4 oz (82.7 kg)   SpO2 100%   BMI 27.73 kg/m   Wt Readings from Last 3 Encounters:  07/26/20 182 lb 6.4 oz (82.7 kg)  10/28/18 183 lb (83 kg)  02/19/18 182 lb (82.6 kg)    Physical Exam Vitals and nursing note reviewed.  Constitutional:      General: He is not in acute distress.    Appearance: Normal appearance. He is obese.  Eyes:     General: No scleral icterus.    Extraocular Movements: Extraocular movements intact.  Cardiovascular:     Rate and Rhythm: Normal rate and regular rhythm.     Heart sounds: Normal heart sounds. No murmur heard.   Pulmonary:     Effort: Pulmonary effort is normal. No respiratory distress.     Breath sounds: Normal breath sounds. No wheezing or rhonchi.  Abdominal:     General: Abdomen is flat. Bowel sounds are normal. There is no distension.  Musculoskeletal:        General: Normal range of motion.  Cervical back: Normal range of motion.     Right lower leg: No edema.     Left lower leg: No edema.  Skin:    General: Skin is warm and dry.     Coloration: Skin is not jaundiced or pale.  Neurological:     General: No focal deficit present.     Mental Status: He is alert and oriented to person, place, and time.     Motor: No weakness.     Gait: Gait normal.  Psychiatric:        Mood and Affect: Mood normal.        Behavior: Behavior normal.        Thought Content: Thought content normal.        Judgment: Judgment normal.       Assessment & Plan:   Problem List  Items Addressed This Visit      Cardiovascular and Mediastinum   Hypertension - Primary    Chronic, ongoing.  BP elevated in clinic today but reports decent control at home with BP mostly in 130/80-140/90 range.  Discussed DASH diet and goals for BP to be less than 130/90 to reduce long-term cardiac complications.  Discussed addition of other medications to help reduce blood pressure, patient declines for now.  Will refill benazepril, lab work deferred until upcoming physical, strongly encouraged to schedule within next 2 months.  Notify clinic if BP >140/90 at home consistently.  With any chest pain or shortness of breath, go to ER.      Relevant Medications   benazepril (LOTENSIN) 40 MG tablet       Follow up plan: Return in about 2 months (around 09/25/2020) for complete physical with fasting labs.

## 2020-07-26 NOTE — Patient Instructions (Signed)
DASH Eating Plan DASH stands for "Dietary Approaches to Stop Hypertension." The DASH eating plan is a healthy eating plan that has been shown to reduce high blood pressure (hypertension). It may also reduce your risk for type 2 diabetes, heart disease, and stroke. The DASH eating plan may also help with weight loss. What are tips for following this plan?  General guidelines  Avoid eating more than 2,300 mg (milligrams) of salt (sodium) a day. If you have hypertension, you may need to reduce your sodium intake to 1,500 mg a day.  Limit alcohol intake to no more than 1 drink a day for nonpregnant women and 2 drinks a day for men. One drink equals 12 oz of beer, 5 oz of wine, or 1 oz of hard liquor.  Work with your health care provider to maintain a healthy body weight or to lose weight. Ask what an ideal weight is for you.  Get at least 30 minutes of exercise that causes your heart to beat faster (aerobic exercise) most days of the week. Activities may include walking, swimming, or biking.  Work with your health care provider or diet and nutrition specialist (dietitian) to adjust your eating plan to your individual calorie needs. Reading food labels   Check food labels for the amount of sodium per serving. Choose foods with less than 5 percent of the Daily Value of sodium. Generally, foods with less than 300 mg of sodium per serving fit into this eating plan.  To find whole grains, look for the word "whole" as the first word in the ingredient list. Shopping  Buy products labeled as "low-sodium" or "no salt added."  Buy fresh foods. Avoid canned foods and premade or frozen meals. Cooking  Avoid adding salt when cooking. Use salt-free seasonings or herbs instead of table salt or sea salt. Check with your health care provider or pharmacist before using salt substitutes.  Do not fry foods. Cook foods using healthy methods such as baking, boiling, grilling, and broiling instead.  Cook with  heart-healthy oils, such as olive, canola, soybean, or sunflower oil. Meal planning  Eat a balanced diet that includes: ? 5 or more servings of fruits and vegetables each day. At each meal, try to fill half of your plate with fruits and vegetables. ? Up to 6-8 servings of whole grains each day. ? Less than 6 oz of lean meat, poultry, or fish each day. A 3-oz serving of meat is about the same size as a deck of cards. One egg equals 1 oz. ? 2 servings of low-fat dairy each day. ? A serving of nuts, seeds, or beans 5 times each week. ? Heart-healthy fats. Healthy fats called Omega-3 fatty acids are found in foods such as flaxseeds and coldwater fish, like sardines, salmon, and mackerel.  Limit how much you eat of the following: ? Canned or prepackaged foods. ? Food that is high in trans fat, such as fried foods. ? Food that is high in saturated fat, such as fatty meat. ? Sweets, desserts, sugary drinks, and other foods with added sugar. ? Full-fat dairy products.  Do not salt foods before eating.  Try to eat at least 2 vegetarian meals each week.  Eat more home-cooked food and less restaurant, buffet, and fast food.  When eating at a restaurant, ask that your food be prepared with less salt or no salt, if possible. What foods are recommended? The items listed may not be a complete list. Talk with your dietitian about   what dietary choices are best for you. Grains Whole-grain or whole-wheat bread. Whole-grain or whole-wheat pasta. Brown rice. Oatmeal. Quinoa. Bulgur. Whole-grain and low-sodium cereals. Pita bread. Low-fat, low-sodium crackers. Whole-wheat flour tortillas. Vegetables Fresh or frozen vegetables (raw, steamed, roasted, or grilled). Low-sodium or reduced-sodium tomato and vegetable juice. Low-sodium or reduced-sodium tomato sauce and tomato paste. Low-sodium or reduced-sodium canned vegetables. Fruits All fresh, dried, or frozen fruit. Canned fruit in natural juice (without  added sugar). Meat and other protein foods Skinless chicken or turkey. Ground chicken or turkey. Pork with fat trimmed off. Fish and seafood. Egg whites. Dried beans, peas, or lentils. Unsalted nuts, nut butters, and seeds. Unsalted canned beans. Lean cuts of beef with fat trimmed off. Low-sodium, lean deli meat. Dairy Low-fat (1%) or fat-free (skim) milk. Fat-free, low-fat, or reduced-fat cheeses. Nonfat, low-sodium ricotta or cottage cheese. Low-fat or nonfat yogurt. Low-fat, low-sodium cheese. Fats and oils Soft margarine without trans fats. Vegetable oil. Low-fat, reduced-fat, or light mayonnaise and salad dressings (reduced-sodium). Canola, safflower, olive, soybean, and sunflower oils. Avocado. Seasoning and other foods Herbs. Spices. Seasoning mixes without salt. Unsalted popcorn and pretzels. Fat-free sweets. What foods are not recommended? The items listed may not be a complete list. Talk with your dietitian about what dietary choices are best for you. Grains Baked goods made with fat, such as croissants, muffins, or some breads. Dry pasta or rice meal packs. Vegetables Creamed or fried vegetables. Vegetables in a cheese sauce. Regular canned vegetables (not low-sodium or reduced-sodium). Regular canned tomato sauce and paste (not low-sodium or reduced-sodium). Regular tomato and vegetable juice (not low-sodium or reduced-sodium). Pickles. Olives. Fruits Canned fruit in a light or heavy syrup. Fried fruit. Fruit in cream or butter sauce. Meat and other protein foods Fatty cuts of meat. Ribs. Fried meat. Bacon. Sausage. Bologna and other processed lunch meats. Salami. Fatback. Hotdogs. Bratwurst. Salted nuts and seeds. Canned beans with added salt. Canned or smoked fish. Whole eggs or egg yolks. Chicken or turkey with skin. Dairy Whole or 2% milk, cream, and half-and-half. Whole or full-fat cream cheese. Whole-fat or sweetened yogurt. Full-fat cheese. Nondairy creamers. Whipped toppings.  Processed cheese and cheese spreads. Fats and oils Butter. Stick margarine. Lard. Shortening. Ghee. Bacon fat. Tropical oils, such as coconut, palm kernel, or palm oil. Seasoning and other foods Salted popcorn and pretzels. Onion salt, garlic salt, seasoned salt, table salt, and sea salt. Worcestershire sauce. Tartar sauce. Barbecue sauce. Teriyaki sauce. Soy sauce, including reduced-sodium. Steak sauce. Canned and packaged gravies. Fish sauce. Oyster sauce. Cocktail sauce. Horseradish that you find on the shelf. Ketchup. Mustard. Meat flavorings and tenderizers. Bouillon cubes. Hot sauce and Tabasco sauce. Premade or packaged marinades. Premade or packaged taco seasonings. Relishes. Regular salad dressings. Where to find more information:  National Heart, Lung, and Blood Institute: www.nhlbi.nih.gov  American Heart Association: www.heart.org Summary  The DASH eating plan is a healthy eating plan that has been shown to reduce high blood pressure (hypertension). It may also reduce your risk for type 2 diabetes, heart disease, and stroke.  With the DASH eating plan, you should limit salt (sodium) intake to 2,300 mg a day. If you have hypertension, you may need to reduce your sodium intake to 1,500 mg a day.  When on the DASH eating plan, aim to eat more fresh fruits and vegetables, whole grains, lean proteins, low-fat dairy, and heart-healthy fats.  Work with your health care provider or diet and nutrition specialist (dietitian) to adjust your eating plan to your   individual calorie needs. This information is not intended to replace advice given to you by your health care provider. Make sure you discuss any questions you have with your health care provider. Document Revised: 11/02/2017 Document Reviewed: 11/13/2016 Elsevier Patient Education  2020 Elsevier Inc.  

## 2020-07-26 NOTE — Assessment & Plan Note (Addendum)
Chronic, ongoing.  BP elevated in clinic today but reports decent control at home with BP mostly in 130/80-140/90 range.  Discussed DASH diet and goals for BP to be less than 130/90 to reduce long-term cardiac complications.  Discussed addition of other medications to help reduce blood pressure, patient declines for now.  Will refill benazepril, lab work deferred until upcoming physical, strongly encouraged to schedule within next 2 months.  Notify clinic if BP >140/90 at home consistently.  With any chest pain or shortness of breath, go to ER.

## 2020-12-15 ENCOUNTER — Encounter: Payer: Self-pay | Admitting: Family Medicine

## 2020-12-15 ENCOUNTER — Other Ambulatory Visit: Payer: Self-pay

## 2020-12-15 ENCOUNTER — Ambulatory Visit (INDEPENDENT_AMBULATORY_CARE_PROVIDER_SITE_OTHER): Payer: 59 | Admitting: Family Medicine

## 2020-12-15 VITALS — BP 174/98 | HR 69 | Temp 98.3°F | Resp 16 | Ht 67.25 in | Wt 182.8 lb

## 2020-12-15 DIAGNOSIS — I10 Essential (primary) hypertension: Secondary | ICD-10-CM | POA: Diagnosis not present

## 2020-12-15 DIAGNOSIS — R972 Elevated prostate specific antigen [PSA]: Secondary | ICD-10-CM

## 2020-12-15 DIAGNOSIS — N200 Calculus of kidney: Secondary | ICD-10-CM | POA: Diagnosis not present

## 2020-12-15 NOTE — Progress Notes (Signed)
New patient visit   Patient: Shane Schmidt   DOB: [birth year]   55 y.o. Male  MRN: [id number] Visit Date: 12/15/2020  Today's healthcare provider: [name], MD   Chief Complaint  Patient presents with  . New Patient (Initial Visit)   Subjective    Shane Schmidt is a 55 y.o. male who presents today as a new patient to establish care.  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  He is married for 30 years.  He has 2 children, daughter at [employer] [employer] [place] and is at [employer] HPI  Patient reports that he feels well today and has no questions or concerns to address. Patient reports that he follows a well balanced diet and is staying active by exercising 4x a week, patient states that sleep patterns are well.   Past Medical History:  Diagnosis Date  . Allergy   . Hypertension    History reviewed. No pertinent surgical history. Family Status  Relation Name Status  . Brother  Alive  . Mother  Alive  . Father  Alive  . MGM  (Not Specified)  . MGF  (Not Specified)  . PGM  Deceased   Family History  Problem Relation Age of Onset  . Hypertension Brother   . Heart Problems Maternal Grandmother   . Heart Problems Maternal Grandfather    Social History   Socioeconomic History  . Marital status: Married    Spouse name: Not on file  . Number of children: Not on file  . Years of education: Not on file  . Highest education level: Not on file  Occupational History  . Not on file  Tobacco Use  . Smoking status: Never Smoker  . Smokeless tobacco: Never Used  Substance and Sexual Activity  . Alcohol use: No  . Drug use: No  . Sexual activity: Not on file  Other Topics Concern  . Not on file  Social History Narrative  . Not on file   Social Determinants of Health   Financial Resource Strain: Not on file  Food Insecurity: Not on file  Transportation Needs: Not on file  Physical Activity: Not on  file  Stress: Not on file  Social Connections: Not on file   Outpatient Medications Prior to Visit  Medication Sig  . benazepril (LOTENSIN) 40 MG tablet Take 1 tablet (40 mg total) by mouth daily.   No facility-administered medications prior to visit.   Allergies  Allergen Reactions  . Penicillins Rash    Immunization History  Administered Date(s) Administered  . Influenza,inj,Quad PF,6+ Mos 12/22/2016, 08/13/2017  . Td 03/26/2015    Health Maintenance  Topic Date Due  . Hepatitis C Screening  Never done  . COVID-19 Vaccine (1) Never done  . INFLUENZA VACCINE  07/04/2020  . TETANUS/TDAP  03/25/2025  . COLONOSCOPY (Pts 45-65yrs Insurance coverage will need to be confirmed)  12/28/2027  . HIV Screening  Completed    Patient Care Team: Jerrol Banana., MD as PCP - General (Family Medicine) Royston Cowper, MD (Urology) Lollie Sails, MD (Inactive) as Consulting Physician (Gastroenterology)  Review of Systems  All other systems reviewed and are negative.   @AMBLABREVIEWLINK @  Objective    BP (!) 174/98   Pulse 69   Temp 98.3 F (36.8 C) (Oral)   Resp 16   Ht 5' 7.25" (1.708 m)   Wt 182 lb 12.8 oz (82.9 kg)   BMI 28.42 kg/m  Physical Exam Vitals reviewed.  HENT:     Head: Normocephalic and atraumatic.     Right Ear: Tympanic membrane and external ear normal.     Left Ear: Tympanic membrane and external ear normal.     Mouth/Throat:     Pharynx: Oropharynx is clear.  Eyes:     General: No scleral icterus.    Conjunctiva/sclera: Conjunctivae normal.  Neck:     Vascular: No carotid bruit.  Cardiovascular:     Rate and Rhythm: Normal rate and regular rhythm.     Pulses: Normal pulses.     Heart sounds: Normal heart sounds.  Pulmonary:     Effort: Pulmonary effort is normal.     Breath sounds: Normal breath sounds.  Abdominal:     Palpations: Abdomen is soft.  Lymphadenopathy:     Cervical: No cervical adenopathy.  Skin:    General: Skin  is warm and dry.  Neurological:     General: No focal deficit present.     Mental Status: He is alert and oriented to person, place, and time.  Psychiatric:        Mood and Affect: Mood normal.        Behavior: Behavior normal.        Thought Content: Thought content normal.        Judgment: Judgment normal.    Home blood pressure readings are 120s to 130s over 80s.  Depression Screen PHQ 2/9 Scores 12/15/2020 07/26/2020 02/19/2018 08/13/2017  PHQ - 2 Score 0 0 0 0  PHQ- 9 Score 0 0 0 -   No results found for any visits on 12/15/20.  Assessment & Plan      1. Hypertension, unspecified type Coat hypertension.  Blood pressure at home good.  And cuff on next visit PE later this year. 2. Elevated PSA Followed by urology. - PSA  3. Kidney stone Followed by urology - Comprehensive metabolic panel  No follow-ups on file.     I, [name], MD, have reviewed all documentation for this visit. The documentation on 12/24/20 for the exam, diagnosis, procedures, and orders are all accurate and complete.    Arlana Canizales Cranford Mon, MD  [facility] [phone] (phone) [phone] (fax)  [place]

## 2020-12-16 LAB — COMPREHENSIVE METABOLIC PANEL
ALT: 32 IU/L (ref 0–44)
AST: 25 IU/L (ref 0–40)
Albumin/Globulin Ratio: 1.6 (ref 1.2–2.2)
Albumin: 4.5 g/dL (ref 3.8–4.9)
Alkaline Phosphatase: 63 IU/L (ref 44–121)
BUN/Creatinine Ratio: 23 — ABNORMAL HIGH (ref 9–20)
BUN: 23 mg/dL (ref 6–24)
Bilirubin Total: 1.1 mg/dL (ref 0.0–1.2)
CO2: 26 mmol/L (ref 20–29)
Calcium: 9.6 mg/dL (ref 8.7–10.2)
Chloride: 100 mmol/L (ref 96–106)
Creatinine, Ser: 1.01 mg/dL (ref 0.76–1.27)
GFR calc Af Amer: 97 mL/min/{1.73_m2} (ref >59)
GFR calc non Af Amer: 84 mL/min/{1.73_m2} (ref >59)
Globulin, Total: 2.8 g/dL (ref 1.5–4.5)
Glucose: 95 mg/dL (ref 65–99)
Potassium: 4.3 mmol/L (ref 3.5–5.2)
Sodium: 139 mmol/L (ref 134–144)
Total Protein: 7.3 g/dL (ref 6.0–8.5)

## 2020-12-16 LAB — PSA: Prostate Specific Ag, Serum: 1.2 ng/mL (ref 0.0–4.0)

## 2020-12-23 ENCOUNTER — Ambulatory Visit: Payer: Self-pay

## 2020-12-23 NOTE — Telephone Encounter (Signed)
Pt given lab results per notes of Dr. Rosanna Randy on 12/23/20. Pt verbalized understanding.   Richard Maceo Pro., MD  12/23/2020 8:42 AM EST      Labs in normal range. Stay hydrated

## 2021-02-16 ENCOUNTER — Other Ambulatory Visit: Payer: Self-pay | Admitting: Family Medicine

## 2021-02-16 NOTE — Telephone Encounter (Signed)
   Notes to clinic:  Review for refill Medication was last filled by provider at old practice    Requested Prescriptions  Pending Prescriptions Disp Refills   benazepril (LOTENSIN) 40 MG tablet 90 tablet 1    Sig: Take 1 tablet (40 mg total) by mouth daily.      Cardiovascular:  ACE Inhibitors Failed - 02/16/2021  2:04 PM      Failed - Last BP in normal range    BP Readings from Last 1 Encounters:  12/15/20 (!) 174/98          Passed - Cr in normal range and within 180 days    Creatinine, Ser  Date Value Ref Range Status  12/15/2020 1.01 0.76 - 1.27 mg/dL Final          Passed - K in normal range and within 180 days    Potassium  Date Value Ref Range Status  12/15/2020 4.3 3.5 - 5.2 mmol/L Final          Passed - Patient is not pregnant      Passed - Valid encounter within last 6 months    Recent Outpatient Visits           2 months ago Hypertension, unspecified type   [facility] Jerrol Banana., MD   6 months ago Essential hypertension   [facility] Eulogio Bear, NP   2 years ago PE (physical exam), annual   [place] Criss[facility], Jeannette How, MD   2 years ago Essential hypertension   [place] Crissman, Jeannette How, MD   3 years ago Essential hypertension   [facility], Mark A, MD       Future Appointments             In 4 months Jerrol Banana., MD [facility], [place]

## 2021-02-16 NOTE — Telephone Encounter (Signed)
Medication Refill - Medication: Benzapril  Has the patient contacted their pharmacy? Yes.   Pt states that he contacted pharmacy and that they could not refill prescription without PCP approval. Please advise.  (Agent: If no, request that the patient contact the pharmacy for the refill.) (Agent: If yes, when and what did the pharmacy advise?)  Preferred Pharmacy (with phone number or street name):  [facility] [place], [address]  [place] [address]  Phone: [phone] Fax: [phone]  Hours: Not open 24 hours     Agent: Please be advised that RX refills may take up to 3 business days. We ask that you follow-up with your pharmacy.

## 2021-02-17 MED ORDER — BENAZEPRIL HCL 40 MG PO TABS: 40.0000 mg | ORAL_TABLET | Freq: Every day | ORAL | 1 refills | Status: DC

## 2021-06-20 ENCOUNTER — Other Ambulatory Visit: Payer: Self-pay

## 2021-06-20 ENCOUNTER — Encounter: Payer: Self-pay | Admitting: Family Medicine

## 2021-06-20 ENCOUNTER — Ambulatory Visit (INDEPENDENT_AMBULATORY_CARE_PROVIDER_SITE_OTHER): Payer: 59 | Admitting: Family Medicine

## 2021-06-20 VITALS — BP 153/89 | HR 76 | Resp 16 | Ht 67.0 in | Wt 184.0 lb

## 2021-06-20 DIAGNOSIS — I10 Essential (primary) hypertension: Secondary | ICD-10-CM | POA: Diagnosis not present

## 2021-06-20 DIAGNOSIS — R972 Elevated prostate specific antigen [PSA]: Secondary | ICD-10-CM | POA: Diagnosis not present

## 2021-06-20 DIAGNOSIS — Z0000 Encounter for general adult medical examination without abnormal findings: Secondary | ICD-10-CM

## 2021-06-20 DIAGNOSIS — D229 Melanocytic nevi, unspecified: Secondary | ICD-10-CM | POA: Diagnosis not present

## 2021-06-20 DIAGNOSIS — R319 Hematuria, unspecified: Secondary | ICD-10-CM

## 2021-06-20 LAB — POCT URINALYSIS DIPSTICK
Bilirubin, UA: NEGATIVE
Glucose, UA: NEGATIVE
Ketones, UA: NEGATIVE
Leukocytes, UA: NEGATIVE
Nitrite, UA: NEGATIVE
Protein, UA: NEGATIVE
Spec Grav, UA: <=1.005 — AB (ref 1.010–1.025)
Urobilinogen, UA: 0.2 E.U./dL
pH, UA: 6.5 (ref 5.0–8.0)

## 2021-06-20 NOTE — Progress Notes (Signed)
I,[name],acting as a scribe for Shane Durie, MD.,have documented all relevant documentation on the behalf of Shane Durie, MD,as directed by  Shane Durie, MD while in the presence of Shane Durie, MD.  Complete physical exam   Patient: Shane Schmidt   DOB: [birth year]   55 y.o. Male  MRN: [id number] Visit Date: 06/20/2021  Today's healthcare provider: [name], MD   Chief Complaint  Patient presents with   Annual Exam   Subjective    Shane Schmidt is a 55 y.o. male who presents today for a complete physical exam.  He reports consuming a general diet. Home gym He generally feels well. He reports sleeping well. He does not have additional problems to discuss today.  HPI    Past Medical History:  Diagnosis Date   Allergy    Hypertension    History reviewed. No pertinent surgical history. Social History   Socioeconomic History   Marital status: Married    Spouse name: Not on file   Number of children: Not on file   Years of education: Not on file   Highest education level: Not on file  Occupational History   Not on file  Tobacco Use   Smoking status: Never   Smokeless tobacco: Never  Substance and Sexual Activity   Alcohol use: No   Drug use: No   Sexual activity: Not on file  Other Topics Concern   Not on file  Social History Narrative   Not on file   Social Determinants of Health   Financial Resource Strain: Not on file  Food Insecurity: Not on file  Transportation Needs: Not on file  Physical Activity: Not on file  Stress: Not on file  Social Connections: Not on file  Intimate Partner Violence: Not on file   Family Status  Relation Name Status   Brother  Alive   Mother  Alive   Father  Alive   MGM  (Not Specified)   MGF  (Not Specified)   PGM  Deceased   Family History  Problem Relation Age of Onset   Hypertension Brother    Heart Problems Maternal Grandmother    Heart  Problems Maternal Grandfather    Allergies  Allergen Reactions   Penicillins Rash    Patient Care Team: Jerrol Banana., MD as PCP - General (Family Medicine) Royston Cowper, MD (Urology) Lollie Sails, MD (Inactive) as Consulting Physician (Gastroenterology)   Medications: Outpatient Medications Prior to Visit  Medication Sig   benazepril (LOTENSIN) 40 MG tablet Take 1 tablet (40 mg total) by mouth daily.   No facility-administered medications prior to visit.    Review of Systems  All other systems reviewed and are negative.     Objective    BP (!) 153/89 (BP Location: Right Arm, Patient Position: Sitting, Cuff Size: Large)   Pulse 76   Resp 16   Ht 5\' 7"  (1.702 m)   Wt 184 lb (83.5 kg)   SpO2 97%   BMI 28.82 kg/m     Physical Exam Vitals reviewed.  Constitutional:      Appearance: He is well-developed.  HENT:     Head: Normocephalic and atraumatic.     Right Ear: External ear normal.     Left Ear: External ear normal.  Eyes:     General: No scleral icterus.    Conjunctiva/sclera: Conjunctivae normal.     Pupils: Pupils are equal, round, and  reactive to light.  Cardiovascular:     Rate and Rhythm: Normal rate and regular rhythm.     Heart sounds: Normal heart sounds.  Pulmonary:     Effort: Pulmonary effort is normal.     Breath sounds: Normal breath sounds.  Abdominal:     General: Bowel sounds are normal.     Palpations: Abdomen is soft. There is no hepatomegaly or splenomegaly.  Genitourinary:    Penis: Normal.      Testes: Normal.  Musculoskeletal:        General: Normal range of motion.     Cervical back: Normal range of motion and neck supple.  Skin:    General: Skin is warm and dry.     Findings: No erythema or rash.  Neurological:     General: No focal deficit present.     Mental Status: He is alert and oriented to person, place, and time.     Deep Tendon Reflexes: Reflexes are normal and symmetric.  Psychiatric:        Mood  and Affect: Mood normal.        Behavior: Behavior normal.        Thought Content: Thought content normal.        Judgment: Judgment normal.      Last depression screening scores PHQ 2/9 Scores 06/20/2021 12/15/2020 07/26/2020  PHQ - 2 Score 0 0 0  PHQ- 9 Score 0 0 0   Last fall risk screening Fall Risk  06/20/2021  Falls in the past year? 0  Number falls in past yr: 0  Injury with Fall? 0  Risk for fall due to : No Fall Risks  Follow up Falls evaluation completed   Last Audit-C alcohol use screening Alcohol Use Disorder Test (AUDIT) 06/20/2021  1. How often do you have a drink containing alcohol? 0  2. How many drinks containing alcohol do you have on a typical day when you are drinking? 0  3. How often do you have six or more drinks on one occasion? 0  AUDIT-C Score 0   A score of 3 or more in women, and 4 or more in men indicates increased risk for alcohol abuse, EXCEPT if all of the points are from question 1   No results found for any visits on 06/20/21.  Assessment & Plan    Routine Health Maintenance and Physical Exam  Exercise Activities and Dietary recommendations  Goals   None     Immunization History  Administered Date(s) Administered   Influenza,inj,Quad PF,6+ Mos 12/22/2016, 08/13/2017   Td 03/26/2015    Health Maintenance  Topic Date Due   Hepatitis C Screening  Never done   Zoster Vaccines- Shingrix (1 of 2) Never done   COVID-19 Vaccine (1) 07/06/2021 (Originally 09/09/1971)   INFLUENZA VACCINE  07/04/2021   TETANUS/TDAP  03/25/2025   COLONOSCOPY (Pts 45-38yrs Insurance coverage will need to be confirmed)  12/28/2027   HIV Screening  Completed   Pneumococcal Vaccine 1-75 Years old  Aged Out   HPV VACCINES  Aged Out    Discussed health benefits of physical activity, and encouraged him to engage in regular exercise appropriate for his age and condition.  1. Annual physical exam  - Lipid panel - TSH - CBC w/Diff/Platelet - Comprehensive  Metabolic Panel (CMET) - PSA - POCT urinalysis dipstick  2. Hypertension, unspecified type Blood pressure  runs 130 over 80s at home - Lipid panel - TSH - CBC w/Diff/Platelet - Comprehensive Metabolic  Panel (CMET)  3. Elevated PSA Refer as appropriate  4. Atypical nevi  - Ambulatory referral to Dermatology  5. Hematuria, unspecified type Microscopic urine negative. - Urine Microscopic - Urine Culture   No follow-ups on file.     I, Shane Durie, MD, have reviewed all documentation for this visit. The documentation on 06/25/21 for the exam, diagnosis, procedures, and orders are all accurate and complete.    Naythan Douthit Cranford Mon, MD  [facility] [phone] (phone) [phone] (fax)  [place]

## 2021-06-21 LAB — URINALYSIS, MICROSCOPIC ONLY
Bacteria, UA: NONE SEEN
Casts: NONE SEEN /lpf
Epithelial Cells (non renal): NONE SEEN /hpf (ref 0–10)
RBC, Urine: NONE SEEN /hpf (ref 0–2)
WBC, UA: NONE SEEN /hpf (ref 0–5)

## 2021-06-22 LAB — URINE CULTURE: Organism ID, Bacteria: NO GROWTH

## 2021-08-24 ENCOUNTER — Other Ambulatory Visit: Payer: Self-pay | Admitting: Family Medicine

## 2022-01-20 NOTE — Progress Notes (Unsigned)
°  ° ° °  Established patient visit   Patient: Shane Schmidt   DOB: 1966-01-01   56 y.o. Male  MRN: [id number] Visit Date: 01/23/2022  Today's healthcare provider: [name], MD   No chief complaint on file.  Subjective    HPI  Hypertension, follow-up  BP Readings from Last 3 Encounters:  06/20/21 (!) 153/89  12/15/20 (!) 174/98  07/26/20 (!) 170/90   Wt Readings from Last 3 Encounters:  06/20/21 184 lb (83.5 kg)  12/15/20 182 lb 12.8 oz (82.9 kg)  07/26/20 182 lb 6.4 oz (82.7 kg)     He was last seen for hypertension 7 months ago.  BP at that visit was 153/89.  Management since that visit includes; on benazepril. He reports {excellent/good/fair/poor:19665} compliance with treatment. He {is/is not:9024} having side effects. {document side effects if present:1} He is following a {diet:21022986} diet. He {is/is not:9024} exercising. He {does/does not:200015} smoke.  Use of agents associated with hypertension: {bp agents assoc with hypertension:511::"none"}.   Outside blood pressures are {***enter patient reported home BP readings, or 'not being checked':1}.  Pertinent labs: Lab Results  Component Value Date   CHOL 172 10/28/2018   HDL 54 10/28/2018   LDLCALC 98 10/28/2018   TRIG 102 10/28/2018   CHOLHDL 3.2 10/28/2018   Lab Results  Component Value Date   NA 139 12/15/2020   K 4.3 12/15/2020   CREATININE 1.01 12/15/2020   GFRNONAA 84 12/15/2020   GLUCOSE 95 12/15/2020   TSH 2.260 10/28/2018     The ASCVD Risk score (Arnett DK, et al., 2019) failed to calculate for the following reasons:   Cannot find a previous HDL lab   Cannot find a previous total cholesterol lab   ---------------------------------------------------------------------------------------------------   Medications: Outpatient Medications Prior to Visit  Medication Sig   benazepril (LOTENSIN) 40 MG tablet TAKE 1 TABLET(40 MG) BY MOUTH DAILY   No facility-administered  medications prior to visit.    Review of Systems  Constitutional:  Negative for appetite change, chills and fever.  Respiratory:  Negative for chest tightness, shortness of breath and wheezing.   Cardiovascular:  Negative for chest pain and palpitations.  Gastrointestinal:  Negative for abdominal pain, nausea and vomiting.   {Labs   Heme   Chem   Endocrine   Serology   Results Review (optional):23779}   Objective    There were no vitals taken for this visit. {Show previous vital signs (optional):23777}  Physical Exam  ***  No results found for any visits on 01/23/22.  Assessment & Plan     ***  No follow-ups on file.      {provider attestation***:1}   [name], MD  [facility] [phone] (phone) [phone] (fax)  [place]

## 2022-01-23 ENCOUNTER — Ambulatory Visit: Payer: Self-pay | Admitting: Family Medicine

## 2022-02-23 ENCOUNTER — Other Ambulatory Visit: Payer: Self-pay | Admitting: Family Medicine

## 2022-03-14 ENCOUNTER — Ambulatory Visit: Payer: 59 | Admitting: Family Medicine

## 2022-03-14 ENCOUNTER — Encounter: Payer: Self-pay | Admitting: Family Medicine

## 2022-03-14 VITALS — BP 160/84 | HR 70 | Temp 97.9°F | Resp 16 | Wt 183.4 lb

## 2022-03-14 DIAGNOSIS — I10 Essential (primary) hypertension: Secondary | ICD-10-CM | POA: Diagnosis not present

## 2022-03-14 DIAGNOSIS — R319 Hematuria, unspecified: Secondary | ICD-10-CM | POA: Diagnosis not present

## 2022-03-14 DIAGNOSIS — R972 Elevated prostate specific antigen [PSA]: Secondary | ICD-10-CM | POA: Diagnosis not present

## 2022-03-14 DIAGNOSIS — Z0000 Encounter for general adult medical examination without abnormal findings: Secondary | ICD-10-CM | POA: Diagnosis not present

## 2022-03-14 DIAGNOSIS — Z125 Encounter for screening for malignant neoplasm of prostate: Secondary | ICD-10-CM

## 2022-03-14 DIAGNOSIS — N200 Calculus of kidney: Secondary | ICD-10-CM

## 2022-03-14 LAB — POCT URINALYSIS DIPSTICK
Bilirubin, UA: NEGATIVE
Glucose, UA: NEGATIVE
Ketones, UA: NEGATIVE
Nitrite, UA: NEGATIVE
Protein, UA: NEGATIVE
Spec Grav, UA: 1.01 (ref 1.010–1.025)
Urobilinogen, UA: 0.2 E.U./dL
pH, UA: 6.5 (ref 5.0–8.0)

## 2022-03-14 LAB — IFOBT (OCCULT BLOOD): IFOBT: POSITIVE

## 2022-03-14 NOTE — Progress Notes (Signed)
?  ? ? ?I,Jana Robinson,acting as a scribe for [name], MD.,have documented all relevant documentation on the behalf of [name], MD,as directed by  [name], MD while in the presence of [name], MD. ? ?Established patient visit ? ? ?Patient: Shane Schmidt   DOB: 10-27-1966   56 y.o. Male  MRN: [id number] ?Visit Date: 03/14/2022 ? ?Today's healthcare provider: [name], MD  ? ?Chief Complaint  ?Patient presents with  ? Hypertension  ? ?Subjective  ?  ?Patient comes in today for complete physical .  He feels well.  ?patient is requesting blood work today.  Also wants to go for second colonoscopy.   ?Hypertension, follow-up ? ?BP Readings from Last 3 Encounters:  ?03/14/22 (!) 160/84  ?06/20/21 (!) 153/89  ?12/15/20 (!) 174/98  ? Wt Readings from Last 3 Encounters:  ?03/14/22 183 lb 6.4 oz (83.2 kg)  ?06/20/21 184 lb (83.5 kg)  ?12/15/20 182 lb 12.8 oz (82.9 kg)  ?  ? ?He was last seen for hypertension 9 months ago.  ?BP at that visit was 153/89.  ?Management since that visit includes; on benazepril 40 mg. ? ?He reports excellent compliance with treatment. ?He is not having side effects. ?He is following a Regular diet. ?He is exercising.  ?He does smoke. ? ?Use of agents associated with hypertension: none.  ? ?Outside blood pressures are average 125/84 ?Symptoms: ?No chest pain No chest pressure  ?No palpitations No syncope  ?No dyspnea No orthopnea  ?No paroxysmal nocturnal dyspnea No lower extremity edema  ? ?Pertinent labs ?Lab Results  ?Component Value Date  ? CHOL 172 10/28/2018  ? HDL 54 10/28/2018  ? [place] 98 10/28/2018  ? TRIG 102 10/28/2018  ? CHOLHDL 3.2 10/28/2018  ? Lab Results  ?Component Value Date  ? NA 139 12/15/2020  ? K 4.3 12/15/2020  ? CREATININE 1.01 12/15/2020  ? GFRNONAA 84 12/15/2020  ? GLUCOSE 95 12/15/2020  ? TSH 2.260 10/28/2018  ?  ? ?The ASCVD Risk score (Arnett DK, et al., 2019) failed to calculate for the following  reasons: ?  Cannot find a previous HDL lab ?  Cannot find a previous total cholesterol lab ? ?--------------------------------------------------------------------------------------------------- ? ? ?Medications: ?Outpatient Medications Prior to Visit  ?Medication Sig  ? benazepril (LOTENSIN) 40 MG tablet TAKE 1 TABLET(40 MG) BY MOUTH DAILY  ? ?No facility-administered medications prior to visit.  ? ? ?Review of Systems  ?Constitutional:  Negative for appetite change, chills and fever.  ?Respiratory:  Negative for chest tightness, shortness of breath and wheezing.   ?Cardiovascular:  Negative for chest pain and palpitations.  ?Gastrointestinal:  Negative for abdominal pain, nausea and vomiting.  ?All other systems reviewed and are negative. ? ?  ?  Objective  ?  ?BP (!) 160/84 (BP Location: Right Arm, Patient Position: Sitting, Cuff Size: Normal)   Pulse 70   Temp 97.9 ?F (36.6 ?C) (Oral)   Resp 16   Wt 183 lb 6.4 oz (83.2 kg)   SpO2 100%   BMI 28.72 kg/m?  ?BP Readings from Last 3 Encounters:  ?03/14/22 (!) 160/84  ?06/20/21 (!) 153/89  ?12/15/20 (!) 174/98  ? ?Wt Readings from Last 3 Encounters:  ?03/14/22 183 lb 6.4 oz (83.2 kg)  ?06/20/21 184 lb (83.5 kg)  ?12/15/20 182 lb 12.8 oz (82.9 kg)  ? ?  ? ?Physical Exam ?Vitals reviewed.  ?Constitutional:   ?   Appearance: He is well-developed.  ?HENT:  ?  Head: Normocephalic and atraumatic.  ?   Right Ear: External ear normal.  ?   Left Ear: External ear normal.  ?Eyes:  ?   General: No scleral icterus. ?   Conjunctiva/sclera: Conjunctivae normal.  ?   Pupils: Pupils are equal, round, and reactive to light.  ?Cardiovascular:  ?   Rate and Rhythm: Normal rate and regular rhythm.  ?   Heart sounds: Normal heart sounds.  ?Pulmonary:  ?   Effort: Pulmonary effort is normal.  ?   Breath sounds: Normal breath sounds.  ?Abdominal:  ?   General: Bowel sounds are normal.  ?   Palpations: Abdomen is soft. There is no hepatomegaly or splenomegaly.  ?Genitourinary: ?    Penis: Normal.   ?   Testes: Normal.  ?Musculoskeletal:     ?   General: Normal range of motion.  ?   Cervical back: Normal range of motion and neck supple.  ?Skin: ?   General: Skin is warm and dry.  ?   Findings: No erythema or rash.  ?Neurological:  ?   General: No focal deficit present.  ?   Mental Status: He is alert and oriented to person, place, and time.  ?   Deep Tendon Reflexes: Reflexes are normal and symmetric.  ?Psychiatric:     ?   Mood and Affect: Mood normal.     ?   Behavior: Behavior normal.     ?   Thought Content: Thought content normal.     ?   Judgment: Judgment normal.  ?  ? ? ?No results found for any visits on 03/14/22. ? Assessment & Plan  ?  ? ?1. Essential hypertension ?Good control.  Patient to work on diet and exercise ?- Lipid panel ?- TSH ?- CBC w/Diff/Platelet ?- Comprehensive Metabolic Panel (CMET) ? ?2. Elevated PSA ?Was elevated compared to previous readings.  Follow-up today ?- Lipid panel ?- TSH ?- CBC w/Diff/Platelet ?- Comprehensive Metabolic Panel (CMET) ?- PSA ? ?3. Screening for prostate cancer ? ? ?4. Hematuria, unspecified type ?UA showed some mild hematuria.  For prostatitis and with elevated PSA we will follow-up in a couple of months and repeat. ?- Urine Culture ?- Urinalysis, microscopic only ? ?5. Annual physical exam ?Urine the year. ?- Lipid panel ?- TSH ?- CBC w/Diff/Platelet ?- Comprehensive Metabolic Panel (CMET) ?- POCT urinalysis dipstick ?- IFOBT POC (occult bld, rslt in office); Future ?- IFOBT POC (occult bld, rslt in office) ? ?6. Recurrent nephrolithiasis ? ? ? ?No follow-ups on file.  ?   ? ?I, [name], MD, have reviewed all documentation for this visit. The documentation on 03/18/22 for the exam, diagnosis, procedures, and orders are all accurate and complete. ? ? ? ?Konner Saiz Cranford Mon, MD  ?[facility] ?[phone] (phone) ?[phone] (fax) ? ?[facility] ?

## 2022-03-16 ENCOUNTER — Other Ambulatory Visit: Payer: Self-pay

## 2022-03-16 LAB — COMPREHENSIVE METABOLIC PANEL
ALT: 40 IU/L (ref 0–44)
AST: 28 IU/L (ref 0–40)
Albumin/Globulin Ratio: 1.7 (ref 1.2–2.2)
Albumin: 4.3 g/dL (ref 3.8–4.9)
Alkaline Phosphatase: 67 IU/L (ref 44–121)
BUN/Creatinine Ratio: 27 — ABNORMAL HIGH (ref 9–20)
BUN: 26 mg/dL — ABNORMAL HIGH (ref 6–24)
Bilirubin Total: 1 mg/dL (ref 0.0–1.2)
CO2: 27 mmol/L (ref 20–29)
Calcium: 9.7 mg/dL (ref 8.7–10.2)
Chloride: 102 mmol/L (ref 96–106)
Creatinine, Ser: 0.95 mg/dL (ref 0.76–1.27)
Globulin, Total: 2.6 g/dL (ref 1.5–4.5)
Glucose: 95 mg/dL (ref 70–99)
Potassium: 4.8 mmol/L (ref 3.5–5.2)
Sodium: 142 mmol/L (ref 134–144)
Total Protein: 6.9 g/dL (ref 6.0–8.5)
eGFR: 95 mL/min/{1.73_m2} (ref >59)

## 2022-03-16 LAB — CBC WITH DIFFERENTIAL/PLATELET
Basophils Absolute: 0.1 10*3/uL (ref 0.0–0.2)
Basos: 1 %
EOS (ABSOLUTE): 0.6 10*3/uL — ABNORMAL HIGH (ref 0.0–0.4)
Eos: 8 %
Hematocrit: 46.7 % (ref 37.5–51.0)
Hemoglobin: 15.2 g/dL (ref 13.0–17.7)
Immature Grans (Abs): 0 10*3/uL (ref 0.0–0.1)
Immature Granulocytes: 0 %
Lymphocytes Absolute: 2.8 10*3/uL (ref 0.7–3.1)
Lymphs: 37 %
MCH: 28.7 pg (ref 26.6–33.0)
MCHC: 32.5 g/dL (ref 31.5–35.7)
MCV: 88 fL (ref 79–97)
Monocytes Absolute: 0.7 10*3/uL (ref 0.1–0.9)
Monocytes: 9 %
Neutrophils Absolute: 3.5 10*3/uL (ref 1.4–7.0)
Neutrophils: 45 %
Platelets: 275 10*3/uL (ref 150–450)
RBC: 5.3 x10E6/uL (ref 4.14–5.80)
RDW: 13 % (ref 11.6–15.4)
WBC: 7.7 10*3/uL (ref 3.4–10.8)

## 2022-03-16 LAB — PSA: Prostate Specific Ag, Serum: 3.3 ng/mL (ref 0.0–4.0)

## 2022-03-16 LAB — URINALYSIS, MICROSCOPIC ONLY
Bacteria, UA: NONE SEEN
Casts: NONE SEEN /lpf
Epithelial Cells (non renal): NONE SEEN /hpf (ref 0–10)

## 2022-03-16 LAB — TSH: TSH: 2.71 u[IU]/mL (ref 0.450–4.500)

## 2022-03-16 LAB — LIPID PANEL
Chol/HDL Ratio: 3.8 ratio (ref 0.0–5.0)
Cholesterol, Total: 170 mg/dL (ref 100–199)
HDL: 45 mg/dL (ref >39)
LDL Chol Calc (NIH): 106 mg/dL — ABNORMAL HIGH (ref 0–99)
Triglycerides: 105 mg/dL (ref 0–149)
VLDL Cholesterol Cal: 19 mg/dL (ref 5–40)

## 2022-03-16 LAB — SPECIMEN STATUS REPORT

## 2022-03-16 MED ORDER — SULFAMETHOXAZOLE-TRIMETHOPRIM 800-160 MG PO TABS: 1.0000 | ORAL_TABLET | Freq: Two times a day (BID) | ORAL | 0 refills | Status: DC

## 2022-03-17 LAB — SPECIMEN STATUS REPORT

## 2022-03-17 LAB — URINE CULTURE: Organism ID, Bacteria: NO GROWTH

## 2022-03-18 ENCOUNTER — Encounter: Payer: Self-pay | Admitting: Family Medicine

## 2022-06-19 NOTE — Progress Notes (Signed)
Established patient visit  I,[name],acting as a scribe for [name], MD.,have documented all relevant documentation on the behalf of [name], MD,as directed by  [name], MD while in the presence of [name], MD.    Patient: Shane Schmidt   DOB: 20-May-1966   56 y.o. Male  MRN: [id number] Visit Date: 06/20/2022  Today's healthcare provider: [name], MD   Chief Complaint  Patient presents with   Follow-up   Hypertension   Subjective    HPI  Patient comes in today for routine follow-up.  He is followed by Dr. Sherol Dade at [facility] from urology Home blood pressure recently was 132/84 Overall patient feels very well and has no complaints.  Follow up for hematuria:  The patient was last seen for this 03/14/2022.  From that visit: UA showed some mild hematuria.  For prostatitis and with elevated PSA we will follow-up in a couple of months and repeat.  -----------------------------------------------------------------------------------------   Hypertension, follow-up  BP Readings from Last 3 Encounters:  06/20/22 (!) 145/97  03/14/22 (!) 160/84  06/20/21 (!) 153/89   Wt Readings from Last 3 Encounters:  06/20/22 185 lb (83.9 kg)  03/14/22 183 lb 6.4 oz (83.2 kg)  06/20/21 184 lb (83.5 kg)     He was last seen for hypertension 3 months ago.  BP at that visit was 160/84. Management since that visit includes encouraging patient to work on diet and exercise.  Outside blood pressures are 132/85.  Pertinent labs Lab Results  Component Value Date   CHOL 170 03/15/2022   HDL 45 03/15/2022   LDLCALC 106 (H) 03/15/2022   TRIG 105 03/15/2022   CHOLHDL 3.8 03/15/2022   Lab Results  Component Value Date   NA 142 03/15/2022   K 4.8 03/15/2022   CREATININE 0.95 03/15/2022   EGFR 95 03/15/2022   GLUCOSE 95 03/15/2022   TSH 2.710 03/15/2022     The 10-year ASCVD risk score (Arnett DK, et al., 2019) is:  7.4%  ---------------------------------------------------------------------------------------------------   Medications: Outpatient Medications Prior to Visit  Medication Sig   benazepril (LOTENSIN) 40 MG tablet TAKE 1 TABLET(40 MG) BY MOUTH DAILY   [DISCONTINUED] sulfamethoxazole-trimethoprim (BACTRIM DS) 800-160 MG tablet Take 1 tablet by mouth 2 (two) times daily. (Patient not taking: Reported on 06/20/2022)   No facility-administered medications prior to visit.    Review of Systems     Objective    BP (!) 145/97 (BP Location: Left Arm, Patient Position: Sitting, Cuff Size: Large)   Pulse 68   Resp 16   Wt 185 lb (83.9 kg)   SpO2 99%   BMI 28.98 kg/m    Physical Exam Vitals reviewed.  Constitutional:      Appearance: He is well-developed.  HENT:     Head: Normocephalic and atraumatic.     Right Ear: External ear normal.     Left Ear: External ear normal.  Eyes:     General: No scleral icterus.    Conjunctiva/sclera: Conjunctivae normal.     Pupils: Pupils are equal, round, and reactive to light.  Cardiovascular:     Rate and Rhythm: Normal rate and regular rhythm.     Heart sounds: Normal heart sounds.  Pulmonary:     Effort: Pulmonary effort is normal.     Breath sounds: Normal breath sounds.  Abdominal:     General: Bowel sounds are normal.     Palpations: Abdomen is soft.  There is no hepatomegaly or splenomegaly.  Genitourinary:    Penis: Normal.      Testes: Normal.  Musculoskeletal:        General: Normal range of motion.     Cervical back: Normal range of motion and neck supple.  Skin:    General: Skin is warm and dry.     Findings: No erythema or rash.  Neurological:     General: No focal deficit present.     Mental Status: He is alert and oriented to person, place, and time.     Deep Tendon Reflexes: Reflexes are normal and symmetric.  Psychiatric:        Mood and Affect: Mood normal.        Behavior: Behavior normal.        Thought Content:  Thought content normal.        Judgment: Judgment normal.       Results for orders placed or performed in visit on 06/20/22  POCT urinalysis dipstick  Result Value Ref Range   Color, UA Yellow    Clarity, UA Clear    Glucose, UA Negative Negative   Bilirubin, UA Negative    Ketones, UA Negative    Spec Grav, UA 1.010 1.010 - 1.025   Blood, UA Positive    pH, UA 6.5 5.0 - 8.0   Protein, UA Negative Negative   Urobilinogen, UA 0.2 0.2 or 1.0 E.U./dL   Nitrite, UA Negative    Leukocytes, UA Negative Negative    Assessment & Plan     1. Hematuria, unspecified type Follow-up urology. - POCT urinalysis dipstick - Ambulatory referral to Urology  2. Primary hypertension Note hypertension, excellent blood pressure control.  3. Elevated PSA Followed by urology at [facility] - Ambulatory referral to Urology - PSA   Return in about 8 months (around 02/19/2023).      I, [name], MD, have reviewed all documentation for this visit. The documentation on 06/23/22 for the exam, diagnosis, procedures, and orders are all accurate and complete.    Marieke Lubke Cranford Mon, MD  [facility] [phone] (phone) [phone] (fax)  [place]

## 2022-06-20 ENCOUNTER — Ambulatory Visit (INDEPENDENT_AMBULATORY_CARE_PROVIDER_SITE_OTHER): Payer: 59 | Admitting: Family Medicine

## 2022-06-20 ENCOUNTER — Encounter: Payer: Self-pay | Admitting: Family Medicine

## 2022-06-20 VITALS — BP 145/97 | HR 68 | Resp 16 | Wt 185.0 lb

## 2022-06-20 DIAGNOSIS — R319 Hematuria, unspecified: Secondary | ICD-10-CM | POA: Diagnosis not present

## 2022-06-20 DIAGNOSIS — R972 Elevated prostate specific antigen [PSA]: Secondary | ICD-10-CM

## 2022-06-20 DIAGNOSIS — I10 Essential (primary) hypertension: Secondary | ICD-10-CM

## 2022-06-20 LAB — POCT URINALYSIS DIPSTICK
Bilirubin, UA: NEGATIVE
Blood, UA: POSITIVE
Glucose, UA: NEGATIVE
Ketones, UA: NEGATIVE
Leukocytes, UA: NEGATIVE
Nitrite, UA: NEGATIVE
Protein, UA: NEGATIVE
Spec Grav, UA: 1.01 (ref 1.010–1.025)
Urobilinogen, UA: 0.2 E.U./dL
pH, UA: 6.5 (ref 5.0–8.0)

## 2022-07-05 ENCOUNTER — Encounter: Payer: Self-pay | Admitting: Family Medicine

## 2022-07-05 NOTE — Telephone Encounter (Signed)
[place] You have been scheduled with Dr Wyline Mood for 01/11/23 at 8:30 Address: [address] [place], [place], [place] [address] Phone: [phone]

## 2022-08-28 ENCOUNTER — Other Ambulatory Visit: Payer: Self-pay | Admitting: Family Medicine

## 2023-02-21 ENCOUNTER — Encounter: Payer: 59 | Admitting: Family Medicine

## 2023-03-05 ENCOUNTER — Other Ambulatory Visit: Payer: Self-pay

## 2023-03-05 ENCOUNTER — Telehealth: Payer: Self-pay | Admitting: Family Medicine

## 2023-03-05 MED ORDER — BENAZEPRIL HCL 40 MG PO TABS: 40.0000 mg | ORAL_TABLET | Freq: Every day | ORAL | 0 refills | Status: AC

## 2023-03-05 NOTE — Telephone Encounter (Signed)
[facility] faxed refill request for the following medications:   benazepril (LOTENSIN) 40 MG tablet     Please advise

## 2023-05-10 ENCOUNTER — Other Ambulatory Visit: Payer: Self-pay | Admitting: Family Medicine

## 2023-05-10 NOTE — Telephone Encounter (Signed)
Pt. States he is no longer a pt. At the practice.

## 2024-11-25 ENCOUNTER — Other Ambulatory Visit (INDEPENDENT_AMBULATORY_CARE_PROVIDER_SITE_OTHER): Payer: Self-pay | Admitting: Vascular Surgery

## 2024-11-25 DIAGNOSIS — M542 Cervicalgia: Secondary | ICD-10-CM

## 2024-11-26 ENCOUNTER — Ambulatory Visit (INDEPENDENT_AMBULATORY_CARE_PROVIDER_SITE_OTHER): Payer: Self-pay

## 2024-11-26 DIAGNOSIS — M542 Cervicalgia: Secondary | ICD-10-CM
